# Patient Record
Sex: Female | Born: 1955
Health system: Southern US, Community
[De-identification: ages and names within clinical notes are randomized; demographics above are authoritative.]

## PROBLEM LIST (undated history)

## (undated) DIAGNOSIS — C801 Malignant (primary) neoplasm, unspecified: Secondary | ICD-10-CM

## (undated) DIAGNOSIS — K7581 Nonalcoholic steatohepatitis (NASH): Secondary | ICD-10-CM

## (undated) DIAGNOSIS — I10 Essential (primary) hypertension: Secondary | ICD-10-CM

## (undated) DIAGNOSIS — G4730 Sleep apnea, unspecified: Secondary | ICD-10-CM

## (undated) DIAGNOSIS — F32A Depression, unspecified: Secondary | ICD-10-CM

## (undated) DIAGNOSIS — E785 Hyperlipidemia, unspecified: Secondary | ICD-10-CM

## (undated) DIAGNOSIS — T7840XA Allergy, unspecified, initial encounter: Secondary | ICD-10-CM

## (undated) DIAGNOSIS — K219 Gastro-esophageal reflux disease without esophagitis: Secondary | ICD-10-CM

## (undated) DIAGNOSIS — K769 Liver disease, unspecified: Secondary | ICD-10-CM

## (undated) DIAGNOSIS — F329 Major depressive disorder, single episode, unspecified: Secondary | ICD-10-CM

## (undated) DIAGNOSIS — M4800 Spinal stenosis, site unspecified: Secondary | ICD-10-CM

## (undated) DIAGNOSIS — K2970 Gastritis, unspecified, without bleeding: Secondary | ICD-10-CM

## (undated) DIAGNOSIS — E119 Type 2 diabetes mellitus without complications: Secondary | ICD-10-CM

## (undated) HISTORY — DX: Major depressive disorder, single episode, unspecified: F32.9

## (undated) HISTORY — PX: OTHER SURGICAL HISTORY: SHX169

## (undated) HISTORY — DX: Gastritis, unspecified, without bleeding: K29.70

## (undated) HISTORY — DX: Depression, unspecified: F32.A

## (undated) HISTORY — DX: Liver disease, unspecified: K76.9

## (undated) HISTORY — PX: CARPAL TUNNEL RELEASE: SHX101

## (undated) HISTORY — DX: Gastro-esophageal reflux disease without esophagitis: K21.9

## (undated) HISTORY — PX: CERVICAL CONE BIOPSY: SUR198

## (undated) HISTORY — PX: MOHS SURGERY: SUR867

## (undated) HISTORY — DX: Essential (primary) hypertension: I10

## (undated) HISTORY — PX: CHOLECYSTECTOMY: SHX55

## (undated) HISTORY — PX: CERVICAL DISCECTOMY: SHX98

## (undated) HISTORY — DX: Type 2 diabetes mellitus without complications: E11.9

## (undated) HISTORY — PX: TONSILLECTOMY: SUR1361

## (undated) HISTORY — PX: LIVER BIOPSY: SHX301

## (undated) HISTORY — PX: APPENDECTOMY: SHX54

## (undated) HISTORY — PX: KNEE ARTHROSCOPY: SUR90

## (undated) HISTORY — PX: ROTATOR CUFF REPAIR: SHX139

## (undated) HISTORY — DX: Sleep apnea, unspecified: G47.30

## (undated) HISTORY — DX: Allergy, unspecified, initial encounter: T78.40XA

## (undated) HISTORY — DX: Hyperlipidemia, unspecified: E78.5

---

## 2004-06-06 ENCOUNTER — Ambulatory Visit: Payer: Self-pay | Admitting: Internal Medicine

## 2004-08-18 HISTORY — PX: BREAST BIOPSY: SHX20

## 2005-01-22 ENCOUNTER — Ambulatory Visit: Payer: Self-pay

## 2005-04-26 ENCOUNTER — Ambulatory Visit: Payer: Self-pay | Admitting: Internal Medicine

## 2005-04-30 ENCOUNTER — Ambulatory Visit: Payer: Self-pay | Admitting: Unknown Physician Specialty

## 2005-10-07 ENCOUNTER — Ambulatory Visit: Payer: Self-pay

## 2006-04-09 ENCOUNTER — Ambulatory Visit: Payer: Self-pay | Admitting: Internal Medicine

## 2006-05-13 ENCOUNTER — Ambulatory Visit: Payer: Self-pay | Admitting: Gerontology

## 2006-06-30 ENCOUNTER — Ambulatory Visit: Payer: Self-pay | Admitting: Unknown Physician Specialty

## 2006-07-06 ENCOUNTER — Ambulatory Visit: Payer: Self-pay | Admitting: Unknown Physician Specialty

## 2006-07-10 ENCOUNTER — Ambulatory Visit: Payer: Self-pay | Admitting: Unknown Physician Specialty

## 2006-07-23 ENCOUNTER — Ambulatory Visit: Payer: Self-pay | Admitting: Internal Medicine

## 2006-10-29 ENCOUNTER — Ambulatory Visit: Payer: Self-pay

## 2006-11-02 ENCOUNTER — Ambulatory Visit: Payer: Self-pay | Admitting: Unknown Physician Specialty

## 2006-11-16 ENCOUNTER — Ambulatory Visit: Payer: Self-pay | Admitting: Internal Medicine

## 2007-01-30 ENCOUNTER — Ambulatory Visit: Payer: Self-pay | Admitting: Unknown Physician Specialty

## 2007-03-08 ENCOUNTER — Ambulatory Visit: Payer: Self-pay | Admitting: Orthopaedic Surgery

## 2007-03-31 ENCOUNTER — Ambulatory Visit: Payer: Self-pay | Admitting: Pain Medicine

## 2007-04-07 ENCOUNTER — Ambulatory Visit: Payer: Self-pay | Admitting: Pain Medicine

## 2007-04-29 ENCOUNTER — Ambulatory Visit (HOSPITAL_COMMUNITY): Admission: RE | Admit: 2007-04-29 | Discharge: 2007-04-30 | Payer: Self-pay | Admitting: Neurosurgery

## 2007-05-21 ENCOUNTER — Encounter: Admission: RE | Admit: 2007-05-21 | Discharge: 2007-05-21 | Payer: Self-pay | Admitting: Neurosurgery

## 2007-06-02 ENCOUNTER — Ambulatory Visit: Payer: Self-pay | Admitting: Internal Medicine

## 2007-06-21 ENCOUNTER — Encounter: Admission: RE | Admit: 2007-06-21 | Discharge: 2007-06-21 | Payer: Self-pay | Admitting: Neurosurgery

## 2007-07-28 ENCOUNTER — Ambulatory Visit: Payer: Self-pay | Admitting: Neurosurgery

## 2007-08-23 ENCOUNTER — Ambulatory Visit: Payer: Self-pay | Admitting: Pain Medicine

## 2007-09-28 ENCOUNTER — Ambulatory Visit: Payer: Self-pay | Admitting: Pain Medicine

## 2007-10-13 ENCOUNTER — Ambulatory Visit: Payer: Self-pay | Admitting: Pain Medicine

## 2007-11-18 ENCOUNTER — Ambulatory Visit: Payer: Self-pay | Admitting: Pain Medicine

## 2007-12-01 ENCOUNTER — Ambulatory Visit: Payer: Self-pay | Admitting: Internal Medicine

## 2007-12-15 ENCOUNTER — Ambulatory Visit: Payer: Self-pay | Admitting: Pain Medicine

## 2007-12-28 ENCOUNTER — Ambulatory Visit: Payer: Self-pay | Admitting: Pain Medicine

## 2008-01-05 ENCOUNTER — Ambulatory Visit: Payer: Self-pay | Admitting: Pain Medicine

## 2008-01-05 ENCOUNTER — Ambulatory Visit: Payer: Self-pay

## 2008-01-19 ENCOUNTER — Ambulatory Visit: Payer: Self-pay

## 2008-02-01 ENCOUNTER — Ambulatory Visit: Payer: Self-pay | Admitting: Pain Medicine

## 2008-02-09 ENCOUNTER — Other Ambulatory Visit: Payer: Self-pay

## 2008-02-09 ENCOUNTER — Ambulatory Visit: Payer: Self-pay | Admitting: Orthopaedic Surgery

## 2008-02-17 ENCOUNTER — Ambulatory Visit: Payer: Self-pay | Admitting: Orthopaedic Surgery

## 2008-02-24 ENCOUNTER — Encounter: Payer: Self-pay | Admitting: Orthopaedic Surgery

## 2008-03-02 ENCOUNTER — Ambulatory Visit: Payer: Self-pay | Admitting: Pain Medicine

## 2008-03-18 ENCOUNTER — Encounter: Payer: Self-pay | Admitting: Orthopaedic Surgery

## 2008-03-29 ENCOUNTER — Ambulatory Visit: Payer: Self-pay | Admitting: Internal Medicine

## 2008-04-04 ENCOUNTER — Ambulatory Visit: Payer: Self-pay | Admitting: Pain Medicine

## 2008-04-18 ENCOUNTER — Encounter: Payer: Self-pay | Admitting: Orthopaedic Surgery

## 2008-05-11 ENCOUNTER — Ambulatory Visit: Payer: Self-pay | Admitting: Pain Medicine

## 2008-05-18 ENCOUNTER — Encounter: Payer: Self-pay | Admitting: Orthopaedic Surgery

## 2008-06-15 ENCOUNTER — Ambulatory Visit: Payer: Self-pay | Admitting: Pain Medicine

## 2008-06-19 ENCOUNTER — Emergency Department: Payer: Self-pay | Admitting: Emergency Medicine

## 2008-07-24 ENCOUNTER — Ambulatory Visit: Payer: Self-pay | Admitting: Internal Medicine

## 2008-12-27 ENCOUNTER — Ambulatory Visit: Payer: Self-pay | Admitting: Internal Medicine

## 2009-01-04 ENCOUNTER — Ambulatory Visit: Payer: Self-pay

## 2009-06-27 ENCOUNTER — Ambulatory Visit: Payer: Self-pay | Admitting: Internal Medicine

## 2010-12-31 NOTE — Op Note (Signed)
NAMENOVIE, MAGGIO            ACCOUNT NO.:  [id number]   MEDICAL RECORD NO.:  [id number]          PATIENT TYPE:  AMB   LOCATION:  SDS                          FACILITY:  [facility]   PHYSICIAN:  Faythe Ghee, M.D. DATE OF BIRTH:  06/07/1956   DATE OF PROCEDURE:  04/29/2007  DATE OF DISCHARGE:                               OPERATIVE REPORT   PREOPERATIVE DIAGNOSIS:  Herniated disc C6-C7, right.   POSTOPERATIVE DIAGNOSIS:  Herniated disc C6-C7, right.   PROCEDURE:  C6-C7 anterior cervical discectomy with bone bank fusion  followed by Mystique anterior cervical plating.   SURGEON:  Faythe Ghee, M.D.   ASSISTANT:  Eustace Moore, M.D.   PROCEDURE IN DETAIL:  After being placed in the supine position, 10  pounds halter traction, the patient's neck was prepped and draped in the  usual sterile fashion.  Localizing fluoroscopy was used prior to  incision to identify the appropriate level.  A transverse incision was  made in the right anterior neck, starting at the midline, and headed  towards the medial aspect of the sternocleidomastoid muscle.  The  platysmal muscle was then incised transversely.  The natural fascial  plane between the strap muscles medially and sternocleidomastoid  laterally was then identified and followed down to the anterior aspect  of the cervical spine.  The longus colli muscles were identified, split  in the midline, stripped away bilaterally with the Geophysicist/field seismologist.  Two discs were able to be exposed, the needle was placed  in the higher one at C5-C6 and, therefore, attention was turned to the  one below which was C6-C7.  The disc was then incised with a 15 blade  and thoroughly cleaned out with pituitary rongeurs and curets.  A high  speed drill was used to widen the interspace.  The microscope was draped  and brought in the field and used the remainder of the case.   Using microsection technique, the residual disc posterior  longitudinal  ligament was removed.  The cut edges of the ligament were then removed  and thorough decompression was carried out bilaterally along the C7  nerve roots.  Large amounts of herniated disc material and bony  overgrowth were encountered towards the right, symptomatic side.  This  was removed to decompress and expose the underlying C7 nerve root.  At  this time, inspection was carried out in all directions for any evidence  of residual compression and none could be identified.  Large amounts of  irrigation were carried out and any bleeding controlled with bipolar  coagulation and Gelfoam.  Measurements were taken and a 9 mm bone bank  plug was reconstituted.  After irrigating once more and confirming  hemostasis, the plug was impacted without difficulty and fluoroscopy  showed it to be in good position.   An appropriate length Mystique anterior cervical plate was then chosen.  Under fluoroscopic guidance, drill holes were placed followed by  tapping, re-tapping, and placing of 13 mm screws x4.  Final fluoroscopy  as best possible showed approach to the C6 level and good placement of  the screws and bony spacer.  At this time, large amounts of irrigation  were carried out.  Any bleeding was controlled with bipolar coagulation.  The wound was then closed with  interrupted Vicryl on the platysma muscle, inverted 5-0 PDS on the subcu  layer, and Steri-Strips on the skin.  A sterile dressing and soft collar  were applied and the patient was extubated and taken to the recovery  room in stable condition.           ______________________________  Faythe Ghee, M.D.     ROK/MEDQ  D:  04/29/2007  T:  04/29/2007  Job:  [id number]

## 2011-05-30 LAB — BASIC METABOLIC PANEL
BUN: 13
CO2: 31
Chloride: 100
Creatinine, Ser: 0.61
GFR calc non Af Amer: >60
Glucose, Bld: 92
Sodium: 138

## 2011-05-30 LAB — CBC
Hemoglobin: 15.9 — ABNORMAL HIGH
Platelets: 255
RDW: 13.3

## 2011-11-28 ENCOUNTER — Ambulatory Visit: Payer: Self-pay | Admitting: Internal Medicine

## 2012-01-26 ENCOUNTER — Ambulatory Visit: Payer: Self-pay | Admitting: Orthopedic Surgery

## 2012-02-03 ENCOUNTER — Ambulatory Visit: Payer: Self-pay | Admitting: Orthopedic Surgery

## 2012-02-16 ENCOUNTER — Encounter: Payer: Self-pay | Admitting: Physician Assistant

## 2012-03-26 ENCOUNTER — Ambulatory Visit: Payer: Self-pay | Admitting: Orthopedic Surgery

## 2012-07-14 ENCOUNTER — Other Ambulatory Visit: Payer: Self-pay | Admitting: Internal Medicine

## 2012-07-14 LAB — COMPREHENSIVE METABOLIC PANEL
Albumin: 3.7 g/dL (ref 3.4–5.0)
Alkaline Phosphatase: 132 U/L (ref 50–136)
Anion Gap: 3 — ABNORMAL LOW (ref 7–16)
Bilirubin,Total: 0.6 mg/dL (ref 0.2–1.0)
Creatinine: 0.69 mg/dL (ref 0.60–1.30)
Glucose: 103 mg/dL — ABNORMAL HIGH (ref 65–99)
Osmolality: 281 (ref 275–301)
Potassium: 4.5 mmol/L (ref 3.5–5.1)
Sodium: 141 mmol/L (ref 136–145)

## 2012-07-14 LAB — LIPID PANEL
Cholesterol: 198 mg/dL (ref 0–200)
HDL Cholesterol: 35 mg/dL — ABNORMAL LOW (ref 40–60)
Triglycerides: 193 mg/dL (ref 0–200)
VLDL Cholesterol, Calc: 39 mg/dL (ref 5–40)

## 2013-01-25 ENCOUNTER — Ambulatory Visit: Payer: Self-pay | Admitting: Internal Medicine

## 2013-02-01 ENCOUNTER — Ambulatory Visit: Payer: Self-pay

## 2013-03-15 ENCOUNTER — Ambulatory Visit: Payer: Self-pay | Admitting: Orthopedic Surgery

## 2013-04-06 ENCOUNTER — Ambulatory Visit: Payer: Self-pay | Admitting: Unknown Physician Specialty

## 2014-08-18 DIAGNOSIS — K2970 Gastritis, unspecified, without bleeding: Secondary | ICD-10-CM

## 2014-08-18 HISTORY — DX: Gastritis, unspecified, without bleeding: K29.70

## 2014-10-26 ENCOUNTER — Ambulatory Visit: Payer: Self-pay | Admitting: Unknown Physician Specialty

## 2014-10-27 ENCOUNTER — Ambulatory Visit: Payer: Self-pay | Admitting: Unknown Physician Specialty

## 2014-11-03 ENCOUNTER — Ambulatory Visit: Payer: Self-pay | Admitting: Unknown Physician Specialty

## 2014-11-13 ENCOUNTER — Ambulatory Visit: Payer: Self-pay | Admitting: Unknown Physician Specialty

## 2014-11-22 ENCOUNTER — Encounter: Payer: Self-pay | Admitting: *Deleted

## 2014-12-08 NOTE — Op Note (Signed)
PATIENT NAME:  Patricia [name], Patricia [name] MR#:  [id number] DATE OF BIRTH:  [birth year]  DATE OF PROCEDURE:  03/15/2013  PREOPERATIVE DIAGNOSIS: Right knee medial meniscus tear and osteoarthritis.   POSTOPERATIVE DIAGNOSIS: Right knee medial meniscus tear and osteoarthritis with plica band.   PROCEDURE: Arthroscopy of the right knee, partial medial meniscectomy and plica excision.   ANESTHESIA: General.   SURGEON: Laurene Footman, M.D.   DESCRIPTION OF PROCEDURE: The patient was brought to the operating room, and after adequate anesthesia was obtained, the right leg was prepped and draped in the usual sterile fashion with a tourniquet applied but not required, and the leg in the arthroscopic legholder. After prepping and draping and carrying out the timeout procedure with patient identification, an inferolateral portal was made, and the arthroscope was introduced. Initial inspection revealed mild patellofemoral disease. Coming around medially, there was a thick medial plica that appeared to impinge on the medial femoral condyle. This was subsequently ablated with the ArthroCare wand. Coming around medially, there was significant articular cartilage loss of the superficial layers. There was some fissuring, but there was no exposed bone. This was present on both femoral and tibial condyles. An inferomedial portal was made, and on probing there was a tear consistent with prior MRI of a very posterior horn tear, close the attachment on the capsule. The ACL was intact, and lateral compartment showed some fraying of the meniscus, but no tear. There was also some significant degenerative changes to the central aspect of the tibial condyle. Femoral condyle appeared relatively normal. The gutters were free of any loose bodies. A meniscal punch was used initially, followed by an ArthroCare wand to get back to a stable margin. After thorough debridement of the meniscus, and then addressing the plica band with the ArthroCare  wand, with pre- and postprocedure pictures having been obtained, the knee was thoroughly irrigated. Instrumentation withdrawn. The wounds were closed with simple interrupted 4-0 nylon. The wound then infiltrated with 20 mL of 0.5% Sensorcaine with epinephrine. Xeroform, 4 x 4's, Webril, and Ace wrap applied. The patient was sent to the recovery room in stable condition.   ESTIMATED BLOOD LOSS: Minimal.   COMPLICATIONS: None.   SPECIMENS: None.     ____________________________ Laurene Footman, MD mjm:dmm D: 03/15/2013 21:32:25 ET T: 03/15/2013 22:13:03 ET JOB#: [id number]  cc: Laurene Footman, MD, <Dictator> Laurene Footman MD ELECTRONICALLY SIGNED 03/16/2013 8:06

## 2014-12-10 NOTE — Op Note (Signed)
PATIENT NAME:  Patricia [name], KALATA MR#:  [id number] DATE OF BIRTH:  1956/07/25  DATE OF PROCEDURE:  02/03/2012  PREOPERATIVE DIAGNOSIS: Left knee medial meniscus tear.   POSTOPERATIVE DIAGNOSIS: Left knee medial meniscus tear, Lateral meniscus tear and patellar subluxation.   PROCEDURES:  1. Arthroscopy, left knee.  2. Partial medial and lateral meniscectomy. 3. Arthroscopic lateral release.   DESCRIPTION OF PROCEDURE:   SURGEON: Laurene Footman, M.D.   ANESTHESIA: General.  DESCRIPTION OF PROCEDURE: The patient was brought to the Operating Room and, after adequate general anesthesia was obtained, the left leg was placed in the arthroscopic legholder with a tourniquet applied to the upper thigh. After prepping and draping in the usual sterile fashion appropriate patient identification and time out procedures were completed. An inferolateral portal was made and the arthroscope was introduced. Initial inspection revealed significant chondromalacia of the patella, particularly the lateral facet, and significant subluxation laterally with a tight lateral retinaculum. Coming around medially, an inferomedial portal was made and on probing the posterior horn of the meniscus was noted to be torn at about the inner third and involving most of the posterior third. There was significant articular cartilage wear with some fissuring as well and small loose flaps of cartilage in both tibial and femoral condyles. The anterior cruciate ligament was intact. The lateral meniscus showed what appeared to be a partial discoid meniscus with a tear in the middle third on probing. This was debrided with use of meniscal punch and then ArthroCare wand to get back to a stable margin. The medial meniscus was addressed in a similar fashion using meniscal punch and ArthroCare wand with subsequent probing showing adequate removal of torn cartilage. At this point, with very tight lateral retinaculum, a lateral release was carried  out with the ArthroCare wand to get much better patellar alignment and after placing an arthroscopic cannula and after completing the procedure this was placed to thoroughly irrigate out the knee and remove any meniscal fragments. All instrumentation was then withdrawn and the wounds dressed with Xeroform, 4 x 4's, Webril, and Ace wrap with 20 mL of 0.5% Sensorcaine with epinephrine infiltrated into the area of the portals.   ESTIMATED BLOOD LOSS: Minimal.   COMPLICATIONS: None.   SPECIMENS: None. ____________________________ Laurene Footman, MD mjm:slb D: 02/03/2012 22:53:17 ET T: 02/04/2012 10:15:28 ET JOB#: [id number]  cc: Laurene Footman, MD, <Dictator> Laurene Footman MD ELECTRONICALLY SIGNED 02/04/2012 13:28

## 2014-12-11 LAB — SURGICAL PATHOLOGY

## 2015-01-05 ENCOUNTER — Other Ambulatory Visit: Payer: Self-pay | Admitting: Nurse Practitioner

## 2015-01-05 ENCOUNTER — Other Ambulatory Visit
Admission: RE | Admit: 2015-01-05 | Discharge: 2015-01-05 | Disposition: A | Discharge status: Home / Self Care | Payer: 59 | Source: Ambulatory Visit | Attending: Nurse Practitioner | Admitting: Nurse Practitioner

## 2015-01-05 DIAGNOSIS — R1011 Right upper quadrant pain: Secondary | ICD-10-CM | POA: Diagnosis not present

## 2015-01-05 DIAGNOSIS — R748 Abnormal levels of other serum enzymes: Secondary | ICD-10-CM

## 2015-01-05 DIAGNOSIS — G8929 Other chronic pain: Secondary | ICD-10-CM | POA: Insufficient documentation

## 2015-01-05 DIAGNOSIS — K7400 Hepatic fibrosis, unspecified: Secondary | ICD-10-CM | POA: Insufficient documentation

## 2015-01-05 DIAGNOSIS — K760 Fatty (change of) liver, not elsewhere classified: Secondary | ICD-10-CM | POA: Insufficient documentation

## 2015-01-05 LAB — BASIC METABOLIC PANEL
Anion gap: 7 (ref 5–15)
BUN: 9 mg/dL (ref 6–20)
CO2: 28 mmol/L (ref 22–32)
Calcium: 9 mg/dL (ref 8.9–10.3)
Chloride: 104 mmol/L (ref 101–111)
Creatinine, Ser: 0.81 mg/dL (ref 0.44–1.00)
GFR calc Af Amer: >60 mL/min (ref >60)
GFR calc non Af Amer: >60 mL/min (ref >60)
Glucose, Bld: 193 mg/dL — ABNORMAL HIGH (ref 65–99)
Potassium: 3.9 mmol/L (ref 3.5–5.1)
Sodium: 139 mmol/L (ref 135–145)

## 2015-01-12 ENCOUNTER — Ambulatory Visit
Admission: RE | Admit: 2015-01-12 | Discharge: 2015-01-12 | Disposition: A | Discharge status: Home / Self Care | Payer: 59 | Source: Ambulatory Visit | Attending: Nurse Practitioner | Admitting: Nurse Practitioner

## 2015-01-12 DIAGNOSIS — G8929 Other chronic pain: Secondary | ICD-10-CM | POA: Insufficient documentation

## 2015-01-12 DIAGNOSIS — R945 Abnormal results of liver function studies: Secondary | ICD-10-CM | POA: Diagnosis present

## 2015-01-12 DIAGNOSIS — K760 Fatty (change of) liver, not elsewhere classified: Secondary | ICD-10-CM

## 2015-01-12 DIAGNOSIS — K7400 Hepatic fibrosis, unspecified: Secondary | ICD-10-CM

## 2015-01-12 DIAGNOSIS — R7309 Other abnormal glucose: Secondary | ICD-10-CM | POA: Diagnosis not present

## 2015-01-12 DIAGNOSIS — R748 Abnormal levels of other serum enzymes: Secondary | ICD-10-CM

## 2015-01-12 DIAGNOSIS — R1011 Right upper quadrant pain: Secondary | ICD-10-CM | POA: Diagnosis present

## 2015-01-12 LAB — HEMOGLOBIN A1C: Hgb A1c MFr Bld: 6.4 % — ABNORMAL HIGH (ref 4.0–6.0)

## 2015-01-12 MED ORDER — GADOBENATE DIMEGLUMINE 529 MG/ML IV SOLN
20.0000 mL | Freq: Once | INTRAVENOUS | Status: AC | PRN
  Administered 2015-01-12: 20 mL via INTRAVENOUS

## 2015-02-07 ENCOUNTER — Encounter: Payer: 59 | Attending: Nurse Practitioner | Admitting: Dietician

## 2015-02-07 ENCOUNTER — Encounter: Payer: Self-pay | Admitting: Dietician

## 2015-02-07 VITALS — Ht 62.0 in | Wt 242.9 lb

## 2015-02-07 DIAGNOSIS — K769 Liver disease, unspecified: Secondary | ICD-10-CM

## 2015-02-07 DIAGNOSIS — K760 Fatty (change of) liver, not elsewhere classified: Secondary | ICD-10-CM | POA: Insufficient documentation

## 2015-02-07 DIAGNOSIS — E669 Obesity, unspecified: Secondary | ICD-10-CM | POA: Insufficient documentation

## 2015-02-07 NOTE — Patient Instructions (Signed)
Spread 10 servings of carbohydrate over 3 meals and 1-2 snacks. Read labels for saturated fat, trans fat and sodium with goal of no more 10 gms of saturated fat, 0 gms trans fat and no more than 2014m sodium/day. Include at least 5 servings of fruits/vegetables per day.

## 2015-02-07 NOTE — Progress Notes (Signed)
Medical Nutrition Therapy: Visit start time: 8:00 am  end time: 9:10am  Assessment:  Diagnosis: fatty liver, obesity Past medical history: hypertension, hyperlipidemia, pre-diabetes with HgA1c of 6.4 Psychosocial issues/ stress concerns: Pt. Rates her stress as "high" Preferred learning method:  Patricia [name] . Hands-on  Current weight: 242.9 lbs  Height: 62 in Medications, supplements: see list Progress and evaluation: Pt. In for initial nutrition assessment. Pt.has made significant diet changes in the past 2 months with weight loss of 6 lbs since her MD visit 12/2014. She is reading labels for sodium and is limiting to less than 2036m per day as instructed by her MD. She has significantly decreased her fat intake by eliminating fried foods and higher fat fast food choices. She is preparing most evening meals at home. Her vegetables are fresh, frozen or canned with no salt added.  Physical activity: none  Dietary Intake:  Usual eating pattern includes 3 meals and 2 snacks per day. Dining out frequency: 3-4 meals per week.  Breakfast: oatmeal or yogurt  Snack: unsalted almonds Lunch: Ex. Small steak, sweet potatoes, cucumber  Snack: fruit or peanut butter and crackers Supper: grilled chicken or salmon, broccoli, potatoes or corn or green peas Snack: typically no snack Beverages: Earl grey tea or water; has eliminated all sugar-sweetened beverages  Nutrition Care Education:  Fatty Liver/ Obesity: Instructed on a meal plan based on 1600 calories to promote continued weight loss including carbohydrate counting and balancing carbohydrate, protein, fat and non-starchy vegetables. Also, discussed low sodium products and low sodium food preparation encouraging to limit meals to 500-600 mg of sodium and practical ways to do this. Instructed on basic label reading including recommendations for saturated and trans fat.  Nutritional Diagnosis:  [place]-3.3 Overweight/obesity As related to history of frequent  high fat choices and lack of portion control.  As evidenced by diet history.  Intervention:  Spread 10 servings of carbohydrate over 3 meals and 1-2 snacks. Read labels for saturated fat, trans fat and sodium with goal of no more 10 gms of saturated fat, 0 gms trans fat and no more than 20049msodium/day. Include at least 5 servings of fruits/vegetables per day.  Education Materials given:  . Food lists/ Planning A Balanced Meal . Sample meal pattern/ menus . Goals/ instructions  Learner/ who was taught:  . Patient   Level of understanding: . Partial understanding; needs review/ practice  Learning barriers: . None  Willingness to learn/ readiness for change: . Eager, change in progress  Monitoring and Evaluation:  Dietary intake, exercise, , and body weight      follow up: 03/14/15 at 8:00am

## 2015-02-13 DIAGNOSIS — M51369 Other intervertebral disc degeneration, lumbar region without mention of lumbar back pain or lower extremity pain: Secondary | ICD-10-CM | POA: Insufficient documentation

## 2015-02-13 DIAGNOSIS — E66813 Obesity, class 3: Secondary | ICD-10-CM | POA: Insufficient documentation

## 2015-02-13 DIAGNOSIS — I10 Essential (primary) hypertension: Secondary | ICD-10-CM | POA: Insufficient documentation

## 2015-02-13 DIAGNOSIS — M5136 Other intervertebral disc degeneration, lumbar region: Secondary | ICD-10-CM | POA: Insufficient documentation

## 2015-02-13 DIAGNOSIS — F5104 Psychophysiologic insomnia: Secondary | ICD-10-CM | POA: Insufficient documentation

## 2015-02-13 DIAGNOSIS — Z6841 Body Mass Index (BMI) 40.0 and over, adult: Secondary | ICD-10-CM | POA: Insufficient documentation

## 2015-03-14 ENCOUNTER — Ambulatory Visit: Payer: 59 | Admitting: Dietician

## 2015-03-30 ENCOUNTER — Encounter: Payer: Self-pay | Admitting: Dietician

## 2015-03-30 ENCOUNTER — Ambulatory Visit: Payer: 59 | Attending: Otolaryngology

## 2015-03-30 DIAGNOSIS — G4733 Obstructive sleep apnea (adult) (pediatric): Secondary | ICD-10-CM | POA: Diagnosis not present

## 2015-03-30 DIAGNOSIS — R0683 Snoring: Secondary | ICD-10-CM | POA: Insufficient documentation

## 2015-04-09 ENCOUNTER — Ambulatory Visit: Payer: 59 | Attending: Otolaryngology

## 2015-04-09 DIAGNOSIS — G4733 Obstructive sleep apnea (adult) (pediatric): Secondary | ICD-10-CM | POA: Diagnosis not present

## 2015-04-09 DIAGNOSIS — R0683 Snoring: Secondary | ICD-10-CM | POA: Diagnosis present

## 2015-04-09 DIAGNOSIS — R0989 Other specified symptoms and signs involving the circulatory and respiratory systems: Secondary | ICD-10-CM | POA: Diagnosis present

## 2015-04-18 ENCOUNTER — Other Ambulatory Visit
Admission: RE | Admit: 2015-04-18 | Discharge: 2015-04-18 | Disposition: A | Discharge status: Home / Self Care | Payer: 59 | Source: Ambulatory Visit | Attending: Nurse Practitioner | Admitting: Nurse Practitioner

## 2015-04-18 DIAGNOSIS — K760 Fatty (change of) liver, not elsewhere classified: Secondary | ICD-10-CM | POA: Diagnosis present

## 2015-04-18 LAB — PROTIME-INR
INR: 1.07
Prothrombin Time: 14.1 seconds (ref 11.4–15.0)

## 2015-06-21 DIAGNOSIS — E559 Vitamin D deficiency, unspecified: Secondary | ICD-10-CM | POA: Insufficient documentation

## 2015-06-22 DIAGNOSIS — M5442 Lumbago with sciatica, left side: Secondary | ICD-10-CM | POA: Insufficient documentation

## 2015-06-22 DIAGNOSIS — M5441 Lumbago with sciatica, right side: Secondary | ICD-10-CM | POA: Insufficient documentation

## 2015-07-11 ENCOUNTER — Other Ambulatory Visit: Payer: Self-pay | Admitting: Nurse Practitioner

## 2015-07-11 DIAGNOSIS — K760 Fatty (change of) liver, not elsewhere classified: Secondary | ICD-10-CM

## 2015-07-18 ENCOUNTER — Ambulatory Visit
Admission: RE | Admit: 2015-07-18 | Discharge: 2015-07-18 | Disposition: A | Discharge status: Home / Self Care | Payer: 59 | Source: Ambulatory Visit | Attending: Nurse Practitioner | Admitting: Nurse Practitioner

## 2015-07-18 DIAGNOSIS — K760 Fatty (change of) liver, not elsewhere classified: Secondary | ICD-10-CM | POA: Insufficient documentation

## 2015-08-22 ENCOUNTER — Other Ambulatory Visit: Payer: Self-pay | Admitting: Internal Medicine

## 2015-08-22 DIAGNOSIS — Z0000 Encounter for general adult medical examination without abnormal findings: Secondary | ICD-10-CM | POA: Diagnosis not present

## 2015-08-22 DIAGNOSIS — Z23 Encounter for immunization: Secondary | ICD-10-CM | POA: Diagnosis not present

## 2015-08-22 DIAGNOSIS — Z124 Encounter for screening for malignant neoplasm of cervix: Secondary | ICD-10-CM | POA: Diagnosis not present

## 2015-08-22 DIAGNOSIS — Z1239 Encounter for other screening for malignant neoplasm of breast: Secondary | ICD-10-CM | POA: Diagnosis not present

## 2015-08-24 DIAGNOSIS — G4733 Obstructive sleep apnea (adult) (pediatric): Secondary | ICD-10-CM | POA: Diagnosis not present

## 2015-08-30 ENCOUNTER — Other Ambulatory Visit: Payer: Self-pay | Admitting: Internal Medicine

## 2015-08-30 DIAGNOSIS — Z780 Asymptomatic menopausal state: Secondary | ICD-10-CM

## 2015-09-03 ENCOUNTER — Ambulatory Visit: Payer: 59

## 2015-09-11 ENCOUNTER — Other Ambulatory Visit: Payer: Self-pay | Admitting: Internal Medicine

## 2015-09-11 ENCOUNTER — Ambulatory Visit
Admission: RE | Admit: 2015-09-11 | Discharge: 2015-09-11 | Disposition: A | Discharge status: Home / Self Care | Payer: 59 | Source: Ambulatory Visit | Attending: Internal Medicine | Admitting: Internal Medicine

## 2015-09-11 DIAGNOSIS — Z1231 Encounter for screening mammogram for malignant neoplasm of breast: Secondary | ICD-10-CM | POA: Insufficient documentation

## 2015-09-11 DIAGNOSIS — Z780 Asymptomatic menopausal state: Secondary | ICD-10-CM

## 2015-09-11 DIAGNOSIS — Z1239 Encounter for other screening for malignant neoplasm of breast: Secondary | ICD-10-CM

## 2015-09-11 DIAGNOSIS — Z13820 Encounter for screening for osteoporosis: Secondary | ICD-10-CM | POA: Diagnosis not present

## 2015-09-11 HISTORY — DX: Malignant (primary) neoplasm, unspecified: C80.1

## 2015-09-21 DIAGNOSIS — M545 Low back pain: Secondary | ICD-10-CM | POA: Diagnosis not present

## 2015-09-21 DIAGNOSIS — M4806 Spinal stenosis, lumbar region: Secondary | ICD-10-CM | POA: Diagnosis not present

## 2015-11-07 ENCOUNTER — Emergency Department: Payer: 59

## 2015-11-07 ENCOUNTER — Encounter: Payer: Self-pay | Admitting: Medical Oncology

## 2015-11-07 ENCOUNTER — Emergency Department
Admission: EM | Admit: 2015-11-07 | Discharge: 2015-11-07 | Disposition: A | Discharge status: Home / Self Care | Payer: 59 | Attending: Emergency Medicine | Admitting: Emergency Medicine

## 2015-11-07 DIAGNOSIS — Z791 Long term (current) use of non-steroidal anti-inflammatories (NSAID): Secondary | ICD-10-CM | POA: Insufficient documentation

## 2015-11-07 DIAGNOSIS — Z79899 Other long term (current) drug therapy: Secondary | ICD-10-CM | POA: Diagnosis not present

## 2015-11-07 DIAGNOSIS — I10 Essential (primary) hypertension: Secondary | ICD-10-CM | POA: Insufficient documentation

## 2015-11-07 DIAGNOSIS — R079 Chest pain, unspecified: Secondary | ICD-10-CM | POA: Diagnosis not present

## 2015-11-07 DIAGNOSIS — R1012 Left upper quadrant pain: Secondary | ICD-10-CM | POA: Diagnosis not present

## 2015-11-07 DIAGNOSIS — F17200 Nicotine dependence, unspecified, uncomplicated: Secondary | ICD-10-CM | POA: Insufficient documentation

## 2015-11-07 DIAGNOSIS — B349 Viral infection, unspecified: Secondary | ICD-10-CM | POA: Diagnosis not present

## 2015-11-07 DIAGNOSIS — R509 Fever, unspecified: Secondary | ICD-10-CM | POA: Diagnosis present

## 2015-11-07 HISTORY — DX: Spinal stenosis, site unspecified: M48.00

## 2015-11-07 LAB — CBC
HEMATOCRIT: 47.2 % — AB (ref 35.0–47.0)
Hemoglobin: 16.1 g/dL — ABNORMAL HIGH (ref 12.0–16.0)
MCH: 31.3 pg (ref 26.0–34.0)
MCHC: 34.2 g/dL (ref 32.0–36.0)
MCV: 91.6 fL (ref 80.0–100.0)
Platelets: 111 10*3/uL — ABNORMAL LOW (ref 150–440)
RBC: 5.15 MIL/uL (ref 3.80–5.20)
RDW: 12.9 % (ref 11.5–14.5)
WBC: 7.5 10*3/uL (ref 3.6–11.0)

## 2015-11-07 LAB — BASIC METABOLIC PANEL
Anion gap: 8 (ref 5–15)
BUN: 10 mg/dL (ref 6–20)
CHLORIDE: 102 mmol/L (ref 101–111)
CO2: 25 mmol/L (ref 22–32)
Calcium: 9.2 mg/dL (ref 8.9–10.3)
Creatinine, Ser: 0.53 mg/dL (ref 0.44–1.00)
GFR calc Af Amer: >60 mL/min (ref >60)
GFR calc non Af Amer: >60 mL/min (ref >60)
GLUCOSE: 153 mg/dL — AB (ref 65–99)
POTASSIUM: 3.8 mmol/L (ref 3.5–5.1)
Sodium: 135 mmol/L (ref 135–145)

## 2015-11-07 LAB — RAPID INFLUENZA A&B ANTIGENS (ARMC ONLY): INFLUENZA B (ARMC): NEGATIVE

## 2015-11-07 LAB — RAPID INFLUENZA A&B ANTIGENS: Influenza A (ARMC): NEGATIVE

## 2015-11-07 LAB — TROPONIN I: Troponin I: <0.03 ng/mL (ref <0.031)

## 2015-11-07 MED ORDER — KETOROLAC TROMETHAMINE 30 MG/ML IJ SOLN
30.0000 mg | Freq: Once | INTRAMUSCULAR | Status: DC
  Filled 2015-11-07: qty 1

## 2015-11-07 MED ORDER — GI COCKTAIL ~~LOC~~
30.0000 mL | Freq: Once | ORAL | Status: AC
  Administered 2015-11-07: 30 mL via ORAL
  Filled 2015-11-07: qty 30

## 2015-11-07 MED ORDER — ONDANSETRON HCL 4 MG PO TABS: 4.0000 mg | ORAL_TABLET | Freq: Every day | ORAL | Status: DC | PRN

## 2015-11-07 MED ORDER — ONDANSETRON 4 MG PO TBDP
4.0000 mg | ORAL_TABLET | Freq: Once | ORAL | Status: AC
  Administered 2015-11-07: 4 mg via ORAL
  Filled 2015-11-07: qty 1

## 2015-11-07 MED ORDER — KETOROLAC TROMETHAMINE 30 MG/ML IJ SOLN
30.0000 mg | Freq: Once | INTRAMUSCULAR | Status: AC
  Administered 2015-11-07: 30 mg via INTRAVENOUS

## 2015-11-07 MED ORDER — ALBUTEROL SULFATE (2.5 MG/3ML) 0.083% IN NEBU
2.5000 mg | INHALATION_SOLUTION | Freq: Once | RESPIRATORY_TRACT | Status: AC
  Administered 2015-11-07: 2.5 mg via RESPIRATORY_TRACT
  Filled 2015-11-07: qty 3

## 2015-11-07 MED ORDER — ALBUTEROL SULFATE HFA 108 (90 BASE) MCG/ACT IN AERS: 2.0000 | INHALATION_SPRAY | Freq: Four times a day (QID) | RESPIRATORY_TRACT | Status: DC | PRN

## 2015-11-07 MED ORDER — SODIUM CHLORIDE 0.9 % IV SOLN
Freq: Once | INTRAVENOUS | Status: AC
  Administered 2015-11-07: 10:00:00 via INTRAVENOUS

## 2015-11-07 NOTE — Discharge Instructions (Signed)
Viral Infections A viral infection can be caused by different types of viruses.Most viral infections are not serious and resolve on their own. However, some infections may cause severe symptoms and may lead to further complications. SYMPTOMS Viruses can frequently cause:  Minor sore throat.  Aches and pains.  Headaches.  Runny nose.  Different types of rashes.  Watery eyes.  Tiredness.  Cough.  Loss of appetite.  Gastrointestinal infections, resulting in nausea, vomiting, and diarrhea. These symptoms do not respond to antibiotics because the infection is not caused by bacteria. However, you might catch a bacterial infection following the viral infection. This is sometimes called a "superinfection." Symptoms of such a bacterial infection may include:  Worsening sore throat with pus and difficulty swallowing.  Swollen neck glands.  Chills and a high or persistent fever.  Severe headache.  Tenderness over the sinuses.  Persistent overall ill feeling (malaise), muscle aches, and tiredness (fatigue).  Persistent cough.  Yellow, green, or brown mucus production with coughing. HOME CARE INSTRUCTIONS   Only take over-the-counter or prescription medicines for pain, discomfort, diarrhea, or fever as directed by your caregiver.  Drink enough water and fluids to keep your urine clear or pale yellow. Sports drinks can provide valuable electrolytes, sugars, and hydration.  Get plenty of rest and maintain proper nutrition. Soups and broths with crackers or rice are fine. SEEK IMMEDIATE MEDICAL CARE IF:   You have severe headaches, shortness of breath, chest pain, neck pain, or an unusual rash.  You have uncontrolled vomiting, diarrhea, or you are unable to keep down fluids.  You or your child has an oral temperature above 102 F (38.9 C), not controlled by medicine.  Your baby is older than 3 months with a rectal temperature of 102 F (38.9 C) or higher.  Your baby is 51  months old or younger with a rectal temperature of 100.4 F (38 C) or higher. MAKE SURE YOU:   Understand these instructions.  Will watch your condition.  Will get help right away if you are not doing well or get worse.   This information is not intended to replace advice given to you by your health care provider. Make sure you discuss any questions you have with your health care provider.   Document Released: 05/14/2005 Document Revised: 10/27/2011 Document Reviewed: 01/10/2015 Elsevier Interactive Patient Education [employer].

## 2015-11-07 NOTE — ED Provider Notes (Addendum)
[facility] Emergency Department Provider Note  ____________________________________________  Time seen: Approximately 930 AM  I have reviewed the triage vital signs and the nursing notes.   HISTORY  Chief Complaint Chest Pain; Abdominal Pain; Fever; and Diarrhea    HPI Patricia [name] is a 60 y.o. female with a history of hypertension and cervical cancer was presenting to the emergency department today with 3 days of cough, nausea and diarrhea as well as upper abdominal pain. She is also complaining of chest pain and a headache. She says that she has had a fever at home. Was also exposed to a relative last week with a "nasty virus." Says that the chest pain has been migrating from the left side yesterday and now is on the right side today. It is cramping. It is not worsened with deep breathing. It is not worsened with exertion. However, the patient does report shortness of breath with exertion. She does still actively smoke. She says that her headache has also been fluctuating and is of 5-6 out of 10 at this time the top of her head and posteriorly. She denies any neck pain. She also says she is cramping abdominal pain is to the left upper quadrant. No recent antibiotic use.   Past Medical History  Diagnosis Date  . Hypertension   . Hyperlipidemia   . GERD (gastroesophageal reflux disease)   . Hepatic disease   . Cancer ([place])     cervical  . Spinal stenosis     There are no active problems to display for this patient.   Past Surgical History  Procedure Laterality Date  . Breast biopsy Right 2006    bx/clip-neg    Current Outpatient Rx  Name  Route  Sig  Dispense  Refill  . Dexlansoprazole (DEXILANT) 30 MG capsule      TAKE 1 CAPSULE (30 MG TOTAL) BY MOUTH ONCE DAILY.         . furosemide (LASIX) 20 MG tablet   Oral   Take 20 mg by mouth daily as needed for edema.         . gabapentin (NEURONTIN) 300 MG capsule   Oral   Take 300 mg by  mouth at bedtime.         . Glucosamine-Chondroitin 250-200 MG TABS   Oral   Take 1 tablet by mouth 2 (two) times daily.          . naproxen sodium (ANAPROX) 220 MG tablet   Oral   Take 220 mg by mouth 2 (two) times daily with a meal.         . propranolol ER (INDERAL LA) 60 MG 24 hr capsule      TAKE ONE CAPSULE BY MOUTH ONCE DAILY         . sucralfate (CARAFATE) 1 G tablet   Oral   Take 1 g by mouth 2 (two) times daily.          . traMADol (ULTRAM) 50 MG tablet   Oral   Take 50 mg by mouth every 6 (six) hours as needed.         . zolpidem (AMBIEN) 5 MG tablet   Oral   Take 5 mg by mouth at bedtime.           Allergies Amlodipine; Codeine; Deconsal ii; Other; Pantoprazole sodium; Promethazine; Pseudoephedrine; Paxil ; Omeprazole; and Povidone iodine  No family history on file.  Social History Social History  Substance Use Topics  .  Smoking status: Current Every Day Smoker  . Smokeless tobacco: None  . Alcohol Use: No    Review of Systems Constitutional: Fever Eyes: No visual changes. ENT: No sore throat. Cardiovascular: As above Respiratory: As above Gastrointestinal:   No constipation. Genitourinary: Negative for dysuria. Musculoskeletal: Negative for back pain. Skin: Negative for rash. Neurological: Negative for focal weakness or numbness.  10-point ROS otherwise negative.  ____________________________________________   PHYSICAL EXAM:  VITAL SIGNS: ED Triage Vitals  Enc Vitals Group     BP 11/07/15 0835 132/84 mmHg     Pulse Rate 11/07/15 0835 80     Resp 11/07/15 0835 17     Temp 11/07/15 0835 98.6 F (37 C)     Temp Source 11/07/15 0835 Oral     SpO2 11/07/15 0835 94 %     Weight 11/07/15 0832 239 lb (108.41 kg)     Height 11/07/15 0832 5' 2"  (1.575 m)     Head Cir --      Peak Flow --      Pain Score 11/07/15 0832 5     Pain Loc --      Pain Edu? --      Excl. in [place]? --     Constitutional: Alert and oriented. Well  appearing and in no acute distress. Eyes: Conjunctivae are normal. PERRL. EOMI. Head: Atraumatic. Nose: No congestion/rhinnorhea. Mouth/Throat: Mucous membranes are moist.   Neck: No stridor.   Cardiovascular: Normal rate, regular rhythm. Grossly normal heart sounds.  Good peripheral circulation.  Chest pain is not reproducible palpation to the right. Respiratory: Normal respiratory effort.  No retractions. Scant expiratory wheezes throughout. Gastrointestinal: Soft with mild upper abdominal tenderness without a positive Murphy's. There is no rebound or guarding. There is no lower abdominal tenderness palpation. No CVA tenderness. Musculoskeletal: No lower extremity tenderness nor edema.  No joint effusions. Neurologic:  Normal speech and language. No gross focal neurologic deficits are appreciated. No gait instability. Skin:  Skin is warm, dry and intact. No rash noted. Psychiatric: Mood and affect are normal. Speech and behavior are normal.  ____________________________________________   LABS (all labs ordered are listed, but only abnormal results are displayed)  Labs Reviewed  BASIC METABOLIC PANEL - Abnormal; Notable for the following:    Glucose, Bld 153 (*)    All other components within normal limits  CBC - Abnormal; Notable for the following:    Hemoglobin 16.1 (*)    HCT 47.2 (*)    Platelets 111 (*)    All other components within normal limits  RAPID INFLUENZA A&B ANTIGENS ([facility] ONLY)  TROPONIN I   ____________________________________________  EKG  ED ECG REPORT I, Doran Stabler, the attending physician, personally viewed and interpreted this ECG.   Date: 11/07/2015  EKG Time: 833  Rate: 82  Rhythm: normal sinus rhythm  Axis: Left axis deviation  Intervals:none  ST&T Change: No ST segment elevation or depression. No abnormal T-wave inversion.  ____________________________________________  RADIOLOGY  MPRESSION: No radiographic evidence of acute  cardiopulmonary disease.  Signed,  Dulcy Fanny. Earleen Newport, DO  [facility]  [facility]   Electronically Signed By: Corrie Mckusick D.O. On: 11/07/2015 09:29 ____________________________________________   PROCEDURES   ____________________________________________   INITIAL IMPRESSION / ASSESSMENT AND PLAN / ED COURSE  Pertinent labs & imaging results that were available during my care of the patient were reviewed by me and considered in my medical decision making (see chart for details).  ----------------------------------------- 11:38  AM on 11/07/2015 -----------------------------------------  Patient says that she feels much improved. Very reassuring lab as well as EKG workup. Lungs were re-auscultated and now are completely clear. Negative flu test. Likely viral syndrome causing the patient's presentation. We'll discharge with albuterol inhaler as well as Zofran. Recommended to the patient to stay hydrated as well as to continue to reduce the amount that she is smoking. She'll use Tylenol at home for body aches. The patient understands the plan and is willing to comply. ____________________________________________   FINAL CLINICAL IMPRESSION(S) / ED DIAGNOSES  Viral syndrome.    Orbie Pyo, MD 11/07/15 1139  Also on reevaluation the oxygen level was 97% on room air.  Orbie Pyo, MD 11/07/15 [phone]

## 2015-11-07 NOTE — ED Notes (Signed)
Pt reports she began 2 days ago with fever, chest pain, upper abd pain, nausea and diarrhea. Pt also reports sob with exertion.

## 2015-11-07 NOTE — ED Notes (Signed)
EKG completed and given to [employer]

## 2015-11-21 DIAGNOSIS — M5136 Other intervertebral disc degeneration, lumbar region: Secondary | ICD-10-CM | POA: Diagnosis not present

## 2015-11-21 DIAGNOSIS — M5441 Lumbago with sciatica, right side: Secondary | ICD-10-CM | POA: Diagnosis not present

## 2015-11-21 DIAGNOSIS — K2900 Acute gastritis without bleeding: Secondary | ICD-10-CM | POA: Diagnosis not present

## 2015-11-21 DIAGNOSIS — I10 Essential (primary) hypertension: Secondary | ICD-10-CM | POA: Diagnosis not present

## 2015-11-26 DIAGNOSIS — G4733 Obstructive sleep apnea (adult) (pediatric): Secondary | ICD-10-CM | POA: Diagnosis not present

## 2015-12-20 ENCOUNTER — Other Ambulatory Visit: Payer: Self-pay | Admitting: Nurse Practitioner

## 2015-12-20 DIAGNOSIS — R1011 Right upper quadrant pain: Secondary | ICD-10-CM

## 2015-12-20 DIAGNOSIS — K7400 Hepatic fibrosis, unspecified: Secondary | ICD-10-CM

## 2015-12-20 DIAGNOSIS — K760 Fatty (change of) liver, not elsewhere classified: Secondary | ICD-10-CM

## 2015-12-20 DIAGNOSIS — G8929 Other chronic pain: Secondary | ICD-10-CM | POA: Diagnosis not present

## 2015-12-20 DIAGNOSIS — E119 Type 2 diabetes mellitus without complications: Secondary | ICD-10-CM | POA: Diagnosis not present

## 2015-12-25 ENCOUNTER — Ambulatory Visit
Admission: RE | Admit: 2015-12-25 | Discharge: 2015-12-25 | Disposition: A | Discharge status: Home / Self Care | Payer: 59 | Source: Ambulatory Visit | Attending: Nurse Practitioner | Admitting: Nurse Practitioner

## 2015-12-25 DIAGNOSIS — Z9049 Acquired absence of other specified parts of digestive tract: Secondary | ICD-10-CM | POA: Diagnosis not present

## 2015-12-25 DIAGNOSIS — G8929 Other chronic pain: Secondary | ICD-10-CM | POA: Diagnosis not present

## 2015-12-25 DIAGNOSIS — K7400 Hepatic fibrosis, unspecified: Secondary | ICD-10-CM

## 2015-12-25 DIAGNOSIS — K760 Fatty (change of) liver, not elsewhere classified: Secondary | ICD-10-CM | POA: Insufficient documentation

## 2015-12-25 DIAGNOSIS — R1011 Right upper quadrant pain: Secondary | ICD-10-CM | POA: Diagnosis not present

## 2015-12-26 DIAGNOSIS — E119 Type 2 diabetes mellitus without complications: Secondary | ICD-10-CM | POA: Diagnosis not present

## 2016-01-10 ENCOUNTER — Encounter: Payer: 59 | Attending: Internal Medicine | Admitting: *Deleted

## 2016-01-10 ENCOUNTER — Encounter: Payer: Self-pay | Admitting: *Deleted

## 2016-01-10 VITALS — BP 136/82 | Ht 62.0 in | Wt 241.5 lb

## 2016-01-10 DIAGNOSIS — E119 Type 2 diabetes mellitus without complications: Secondary | ICD-10-CM | POA: Insufficient documentation

## 2016-01-10 NOTE — Patient Instructions (Addendum)
Check blood sugars 2 x day before breakfast and 2 hrs after supper every day Exercise: Begin walking for  10-15  minutes 3 days a week as tolerated Eat 3 meals day,   2  snacks a day Space meals 4-6 hours apart Make an eye doctor appointment Bring blood sugar records to the next class

## 2016-01-10 NOTE — Progress Notes (Signed)
Diabetes Self-Management Education  Visit Type: First/Initial  Appt. Start Time: 1555 Appt. End Time: 0923  01/10/2016  Patricia [name], identified by name and date of birth, is a 60 y.o. female with a diagnosis of Diabetes: Type 2.   ASSESSMENT  Blood pressure 136/82, height 5' 2"  (1.575 m), weight 241 lb 8 oz (109.544 kg). Body mass index is 44.16 kg/(m^2).      Diabetes Self-Management Education - 01/10/16 1725    Visit Information   Visit Type First/Initial   Initial Visit   Diabetes Type Type 2   Are you currently following a meal plan? Yes   What type of meal plan do you follow? "no sugar, low sodium, trying to eat healthier"   Are you taking your medications as prescribed? Yes   Date Diagnosed May 2017   Health Coping   How would you rate your overall health? Fair   Psychosocial Assessment   Patient Belief/Attitude about Diabetes Motivated to manage diabetes   Self-care barriers None   Self-management support Doctor's office   Patient Concerns Nutrition/Meal planning;Medication;Glycemic Control;Weight Control;Healthy Lifestyle   Special Needs None   Preferred Learning Style Auditory;Visual;Hands on   [place] in progress   How often do you need to have someone help you when you read instructions, pamphlets, or other written materials from your doctor or pharmacy? 1 - Never   What is the last grade level you completed in school? ADN   Pre-Education Assessment   Patient understands the diabetes disease and treatment process. Needs Review   Patient understands incorporating nutritional management into lifestyle. Needs Review   Patient undertands incorporating physical activity into lifestyle. Needs Instruction   Patient understands using medications safely. Needs Instruction   Patient understands monitoring blood glucose, interpreting and using results Needs Review   Patient understands prevention, detection, and treatment of acute complications.  Needs Instruction   Patient understands prevention, detection, and treatment of chronic complications. Needs Review   Patient understands how to develop strategies to address psychosocial issues. Needs Instruction   Patient understands how to develop strategies to promote health/change behavior. Needs Instruction   Complications   Last HgB A1C per patient/outside source 8.2 %  12/20/15   How often do you check your blood sugar? 1-2 times/day   Fasting Blood glucose range (mg/dL) 70-129;130-179   Postprandial Blood glucose range (mg/dL) --  bedtime readings 91-195 mg/dL    Have you had a dilated eye exam in the past 12 months? No   Have you had a dental exam in the past 12 months? No   Are you checking your feet? Yes   How many days per week are you checking your feet? 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   Medications Reviewed patients medication for diabetes, action, purpose, timing of dose and side effects.   Monitoring Purpose and frequency of SMBG.;Identified appropriate SMBG and/or A1C goals.   Chronic complications Relationship between chronic complications and blood glucose  control   Psychosocial adjustment Identified and addressed patients feelings and concerns about diabetes   Personal strategies to promote health Review risk of smoking and offered smoking cessation   Individualized Goals (developed by patient)   Reducing Risk Improve blood sugars Decrease medications Prevent diabetes complications Lose weight Lead a healthier lifestyle Become more fit   Outcomes   Expected Outcomes Demonstrated interest in learning. Expect positive outcomes      Individualized Plan for Diabetes Self-Management Training:   Learning Objective:  Patient will have a greater understanding of diabetes self-management. Patient education plan is to attend individual and/or group sessions per assessed needs and concerns.   Plan:   Patient Instructions  Check blood sugars 2 x day before breakfast and 2 hrs after supper every day Exercise: Begin walking for  10-15  minutes 3 days a week as tolerated Eat 3 meals day,   2  snacks a day Space meals 4-6 hours apart Make an eye doctor appointment Bring blood sugar records to the next class   Expected Outcomes:  Demonstrated interest in learning. Expect positive outcomes  Education material provided:  General Meal Planning Guidelines Simple Meal Plan  If problems or questions, patient to contact team via:   Johny Drilling, [place], [place], CDE [phone]  Future DSME appointment:  Monday January 21, 2016 for Diabetes Class 1

## 2016-01-21 ENCOUNTER — Encounter: Payer: Self-pay | Admitting: Dietician

## 2016-01-21 ENCOUNTER — Encounter: Payer: 59 | Attending: Internal Medicine | Admitting: Dietician

## 2016-01-21 DIAGNOSIS — E119 Type 2 diabetes mellitus without complications: Secondary | ICD-10-CM | POA: Insufficient documentation

## 2016-01-21 NOTE — Progress Notes (Signed)

## 2016-01-28 ENCOUNTER — Encounter: Payer: 59 | Admitting: *Deleted

## 2016-01-28 ENCOUNTER — Encounter: Payer: Self-pay | Admitting: *Deleted

## 2016-01-28 VITALS — Wt 237.0 lb

## 2016-01-28 DIAGNOSIS — E119 Type 2 diabetes mellitus without complications: Secondary | ICD-10-CM | POA: Diagnosis not present

## 2016-01-28 NOTE — Progress Notes (Signed)
Appt. Start Time: 1730 Appt. End Time: 2030  Class 2 Diabetes Overview - define DM; state own type of DM; identify functions of pancreas and insulin; define insulin deficiency vs insulin resistance  Psychosocial - identify DM as a source of stress; state the effects of stress on BG control; verbalize appropriate stress management techniques; identify personal stress issues   Nutritional Management - describe effects of food on blood glucose; identify sources of carbohydrate, protein and fat; verbalize the importance of balance meals in controlling blood glucose; identify meals as well balanced or not; estimate servings of carbohydrate from menus; use food labels to identify servings size, content of carbohydrate, fiber, protein, fat, saturated fat and sodium; recognize food sources of fat, saturated fat, trans fat, sodium and verbalize goals for intake; describe healthful appropriate food choices when dining out   Exercise - describe the effects of exercise on blood glucose and importance of regular exercise in controlling diabetes; state a plan for personal exercise; verbalize contraindications for exercise  Medications - state name, dose, timing of currently prescribed medications; describe types of medications available for diabetes  Self-Monitoring - state importance of HBGM and demo procedure accurately; use HBGM results to effectively manage diabetes; identify importance of regular HbA1C testing and goals for results  Acute Complications/Sick Day Guidelines - recognize hyperglycemia and hypoglycemia with causes and effects; identify blood glucose results as high, low or in control; list steps in treating and preventing high and low blood glucose; state appropriate measure to manage blood glucose when ill (need for meds, HBGM plan, when to call physician, need for fluids)  Chronic Complications/Foot, Skin, Eye Dental Care - identify possible long-term complications of diabetes (retinopathy,  neuropathy, nephropathy, cardiovascular disease, infections); explain steps in prevention and treatment of chronic complications; state importance of daily self-foot exams; describe how to examine feet and what to look for; explain appropriate eye and dental care  Lifestyle Changes/Goals & Health/Community Resources - state benefits of making appropriate lifestyle changes; identify habits that need to change (meals, tobacco, alcohol); identify strategies to reduce risk factors for personal health; set goals for proper diabetes care; state need for and frequency of healthcare follow-up; describe appropriate community resources for good health (ADA, web sites, apps)   Pregnancy/Sexual Health - define gestational diabetes; state importance of good blood glucose control and birth control prior to pregnancy; state importance of good blood glucose control in preventing sexual problems (impotence, vaginal dryness, infections, loss of desire); state relationship of blood glucose control and pregnancy outcome; describe risk of maternal and fetal complications  Teaching Materials Used: Class 2 Slide Packet A1C Pamphlet Foot Care Literature Menu Ideas Goals for Class 2

## 2016-01-29 NOTE — Addendum Note (Signed)
Addended by: Inis Sizer on: 01/29/2016 10:15 AM   Modules accepted: Medications

## 2016-01-30 DIAGNOSIS — E119 Type 2 diabetes mellitus without complications: Secondary | ICD-10-CM | POA: Diagnosis not present

## 2016-02-04 ENCOUNTER — Encounter: Payer: Self-pay | Admitting: Dietician

## 2016-02-04 ENCOUNTER — Encounter: Payer: 59 | Admitting: Dietician

## 2016-02-04 VITALS — BP 134/70 | Ht 62.0 in | Wt 237.1 lb

## 2016-02-04 DIAGNOSIS — E119 Type 2 diabetes mellitus without complications: Secondary | ICD-10-CM

## 2016-02-04 NOTE — Progress Notes (Signed)

## 2016-02-06 DIAGNOSIS — B372 Candidiasis of skin and nail: Secondary | ICD-10-CM | POA: Diagnosis not present

## 2016-02-06 DIAGNOSIS — E119 Type 2 diabetes mellitus without complications: Secondary | ICD-10-CM | POA: Diagnosis not present

## 2016-02-13 NOTE — Patient Outreach (Addendum)
[facility]) Care Management  02/13/2016  Patricia [name] [birth year] [id number]   [employer] has completed the [facility] diabetes education. I have reached out to her and she has responded with some available dates- she will confirm the exact time and date she would like to attend.   Gentry Fitz, RN, BA, [place], [place] Direct Dial:  [phone]  Fax:  [phone] E-mail: [email or web address] [address] [place],   [address]

## 2016-02-14 DIAGNOSIS — H43313 Vitreous membranes and strands, bilateral: Secondary | ICD-10-CM | POA: Diagnosis not present

## 2016-02-14 DIAGNOSIS — H5213 Myopia, bilateral: Secondary | ICD-10-CM | POA: Diagnosis not present

## 2016-02-22 ENCOUNTER — Telehealth: Payer: Self-pay

## 2016-02-22 ENCOUNTER — Encounter: Payer: Self-pay | Admitting: *Deleted

## 2016-02-22 ENCOUNTER — Ambulatory Visit: Payer: Self-pay

## 2016-02-22 DIAGNOSIS — E1140 Type 2 diabetes mellitus with diabetic neuropathy, unspecified: Secondary | ICD-10-CM

## 2016-02-22 NOTE — Patient Outreach (Signed)
[name]) Care Management  [place]  02/22/2016   Patricia [name] September 05, 1955 [id number]  Subjective: Spoke with Patricia [name] by phone today to complete our initial Link to Wellness assessment. She was diagnosed with Type 2 diabetes in late April after it was discovered that her A1C had jumped from 6.2% to 8.2% after receiving steroids for spinal stenosis.  She was not surprised since she had had pre- diabetes and was having increased urination and increased thirst. She has also been diagnosed with fatty liver (past medical history) and has multiple complaints of intermittent joint pain (neck, shoulders, knees, feet and back) related to a bulging disk in her neck, past history of discectomy, arthritis, spinal stenosis, and peripheral neuropathy). She manages her pain with exercise and medication. Her foot pain (neuropathy) has improved greatly since she started Gabapentin but she has to alternate between 333m and 602mbecause she thinks it makes her dizzy. Her foot pain is made worse when she stands a lot and her back pain is worse with laying in bed or over working in the garden.   BrZayneells me she has lost about 20 lbs since diagnosis (245lbs) and about 30lbs from her heaviest weight(260lbs). Her current stated weight is 229lbs this am.  Her immediate goal is to get to 220lbs. She weighs daily and brings her breakfast and lunch to work with her.   BrLeslieports fasting blood sugars are 90-10241ml and 2 hour post prandial blood sugars are 105-130m38m  Encounter Medications:  Outpatient Encounter Prescriptions as of 02/22/2016  Medication Sig Note  . albuterol (PROVENTIL HFA;VENTOLIN HFA) 108 (90 Base) MCG/ACT inhaler Inhale 2 puffs into the lungs every 6 (six) hours as needed for wheezing or shortness of breath.   . Dexlansoprazole (DEXILANT) 30 MG capsule TAKE 1 CAPSULE (30 MG TOTAL) BY MOUTH ONCE DAILY.   . furosemide (LASIX) 20 MG tablet Take 20 mg by mouth daily  as needed for edema.   . gabapentin (NEURONTIN) 300 MG capsule Take 600 mg by mouth at bedtime. Alternates between 300 and 600mg56m day because it causes her to be dizzy   . Glucosamine-Chondroitin 250-200 MG TABS Take 1 tablet by mouth 2 (two) times daily.    . metFORMIN (GLUCOPHAGE) 500 MG tablet Take 500 mg by mouth 2 (two) times daily. 01/10/2016: Received from: [facility] by mouth.  . methocarbamol (ROBAXIN) 750 MG tablet Take 1 tablet by mouth at bedtime. 01/10/2016: Received from: [facility] ondansetron (ZOFRAN) 4 MG tablet Take 1 tablet (4 mg total) by mouth daily as needed.   . propranolol ER (INDERAL LA) 60 MG 24 hr capsule TAKE ONE CAPSULE BY MOUTH ONCE DAILY   . sucralfate (CARAFATE) 1 G tablet Take 1 g by mouth 4 (four) times daily -  with meals and at bedtime. Only taking 1Gram qam   . traMADol (ULTRAM) 50 MG tablet Take 50 mg by mouth every 6 (six) hours as needed. Only using about 1/2 tab once a month   . zolpidem (AMBIEN) 5 MG tablet Take 5 mg by mouth at bedtime.    No facility-administered encounter medications on file as of 02/22/2016.    Functional Status:  In your present state of health, do you have any difficulty performing the following activities: 02/22/2016  Hearing? N  Vision? N  Difficulty concentrating or making decisions? N  Walking or climbing stairs? Y  Dressing or bathing?  N  Doing errands, shopping? N    Fall/Depression Screening: PHQ 2/9 Scores 02/22/2016 01/10/2016 02/07/2015  PHQ - 2 Score 1 4 1   PHQ- 9 Score - 10 -   Plan:  [facility] CM Care Plan Problem One        Most Recent Value   Care Plan Problem One  Potential for elevated A1C   Role Documenting the Problem One  Care Management Coordinator   Care Plan for Problem One  Active   THN Long Term Goal (31-90 days)  Patient will achieve an A1C of less than 7.0% when it is checked by MD at her next visit in September 2017   [place]  Term Goal Start Date  02/22/16   Interventions for Problem One Long Term Goal  Reviewed the important of consistent exercise. Reviewed diabetes mealplanning. Reviewed the need to check sugars 2x/day to remain aware of trends.  Praised patient on taking pre-made meals to work for lunch and breakfast and eating a balanced diabetic meal for supper. Praised patient for avoiding sweetened beverages.      I will follow up with her in late August.  I have advised her to check BP when she is dizzy and call the MD if this persists.  To call me at anytime for questions. Follow up with MD in August for labs and September for visit.    Gentry Fitz, RN, BA, [place], [place] Direct Dial:  [phone]  Fax:  [phone] E-mail: [email or web address] [address] [place], [place]  [address]

## 2016-02-27 DIAGNOSIS — G4733 Obstructive sleep apnea (adult) (pediatric): Secondary | ICD-10-CM | POA: Diagnosis not present

## 2016-04-07 ENCOUNTER — Other Ambulatory Visit: Payer: Self-pay

## 2016-04-07 VITALS — BP 122/68 | Ht 62.0 in | Wt 222.5 lb

## 2016-04-07 DIAGNOSIS — E1140 Type 2 diabetes mellitus with diabetic neuropathy, unspecified: Secondary | ICD-10-CM

## 2016-04-07 NOTE — Patient Outreach (Signed)
[facility]) Care Management  [place]  04/07/2016   Patricia [name] 12-04-1955 [id number]  Subjective: Patient in for follow up visit in the Link to Wellness diabetes program.  She did not bring her meter but reports fasting blood sugars of 88-104 mg/dl and 2 hour post prandial blood sugars 98-145 mg/dl.  She reports no hypoglycemia.  She reports eating 2-3 meals per day and 2 snacks, but sometimes she skips lunch, especially on the weekends.  She tells me she is documenting her calories each day and is only eating about 1000 kcal/day.  She reports exercising 20-30 minutes per day, 3 days per week plus she swims and works in her garden.   Objective:  Vitals:   04/07/16 1340  BP: 122/68  Weight: 222 lb 8 oz (100.9 kg)  Height: 1.575 m (5' 2" )     Encounter Medications:  Outpatient Encounter Prescriptions as of 04/07/2016  Medication Sig Note  . albuterol (PROVENTIL HFA;VENTOLIN HFA) 108 (90 Base) MCG/ACT inhaler Inhale 2 puffs into the lungs every 6 (six) hours as needed for wheezing or shortness of breath.   . Dexlansoprazole (DEXILANT) 30 MG capsule TAKE 1 CAPSULE (30 MG TOTAL) BY MOUTH ONCE DAILY.   . furosemide (LASIX) 20 MG tablet Take 20 mg by mouth daily as needed for edema.   . gabapentin (NEURONTIN) 300 MG capsule Take 600 mg by mouth at bedtime. Alternates between 300 and 669m per day because it causes her to be dizzy   . Glucosamine-Chondroitin 250-200 MG TABS Take 1 tablet by mouth 2 (two) times daily.    . metFORMIN (GLUCOPHAGE) 500 MG tablet Take 500 mg by mouth 2 (two) times daily. 01/10/2016: [facility]ceived [facility]: [facility] by mouth.  . methocarbamol (ROBAXIN) 750 MG tablet Take 1 tablet by mouth at bedtime. 01/10/2016: Received from: [facility]   . ondansetron (ZOFRAN) 4 MG tablet Take 1 tablet (4 mg total) by mouth daily as needed.   . propranolol ER (INDERAL LA) 60 MG 24 hr  capsule TAKE ONE CAPSULE BY MOUTH ONCE DAILY   . sucralfate (CARAFATE) 1 G tablet Take 1 g by mouth 4 (four) times daily -  with meals and at bedtime. Only taking 1Gram qam   . traMADol (ULTRAM) 50 MG tablet Take 50 mg by mouth every 6 (six) hours as needed. Only using about 1/2 tab once a month   . zolpidem (AMBIEN) 5 MG tablet Take 5 mg by mouth at bedtime.    No facility-administered encounter medications on file as of 04/07/2016.     Functional Status:  In your present state of health, do you have any difficulty performing the following activities: 02/22/2016  Hearing? N  Vision? N  Difficulty concentrating or making decisions? N  Walking or climbing stairs? Y  Dressing or bathing? N  Doing errands, shopping? N  Some recent data might be hidden    Fall/Depression Screening: PHQ 2/9 Scores 02/22/2016 01/10/2016 02/07/2015  PHQ - 2 Score 1 4 1   PHQ- 9 Score - 10 -    Assessment: In [name]'s attempt to improve her health, I believe she may be eating too few calories, she also admits that she is getting tired of eating the same kinds of food. The dietitian told her the least number of calories she should aim for is 1200 kcal. She is occasionally skipping meals- a behavior, not recommended for people who have diabetes; it may  increase the risk of hypoglycemia.  She is more engaged in exercise than she has been in the past.   Plan:   [facility] CM Care Plan Problem One   Flowsheet Row Most Recent Value  THN Long Term Goal (31-90 days)  (P) Patient will achieve an A1C of less than 7.0% when it is checked by MD at her next visit in September 2017  [place] Term Goal Start Date  (P) 02/22/16  Interventions for Problem One Long Term Goal  (P) Reviewed the important of consistent exercise. Reviewed diabetes mealplanning. Reviewed the need to check sugars 2x/day to remain aware of trends.  Praised patient on taking pre-made meals to work for lunch and breakfast and eating a balanced diabetic meal for  supper. Praised patient for avoiding sweetened beverages.      Follow up with MD on 05/09/16.     Gentry Fitz, RN, BA, [place], [place] Direct Dial:  [phone]  Fax:  [phone] E-mail: [email or web address] [address] [place], [place]  [address]

## 2016-05-02 DIAGNOSIS — E119 Type 2 diabetes mellitus without complications: Secondary | ICD-10-CM | POA: Diagnosis not present

## 2016-05-09 DIAGNOSIS — F5104 Psychophysiologic insomnia: Secondary | ICD-10-CM | POA: Diagnosis not present

## 2016-05-09 DIAGNOSIS — E119 Type 2 diabetes mellitus without complications: Secondary | ICD-10-CM | POA: Diagnosis not present

## 2016-05-09 DIAGNOSIS — K760 Fatty (change of) liver, not elsewhere classified: Secondary | ICD-10-CM | POA: Diagnosis not present

## 2016-05-09 DIAGNOSIS — I10 Essential (primary) hypertension: Secondary | ICD-10-CM | POA: Diagnosis not present

## 2016-05-14 ENCOUNTER — Other Ambulatory Visit: Payer: Self-pay

## 2016-05-14 NOTE — Patient Outreach (Signed)
[facility]) Care Management  05/14/2016  Patricia [name] 1955/10/08 [id number]   Spoke with patient by phone today regarding her recent visit with MD and A1C of 5.6%.  She tells me she has lost weight after changing her diet and being active in her garden.  MD has decreased her Metformin to ER 550m once a day- Patricia [name] it at lunchtime.    [name]  BShandiinreports fasting blood sugars 78-954mdl and 2hr post prandial blood sugars 115-12863ml.  She denies hypo or hyperglycemia.   Patricia [name]   Flowsheet Row Most Recent Value  Care Plan Problem [name]  Potential for elevated A1C  Role Documenting the Problem [name]  Care Management Coordinator  Care Plan for Problem [name]  Not Active  Patricia [name] Long Term Goal (31-90 days)  Patient will achieve an A1C of less than 7.0% when it is checked by MD at her next visit in September 2017  [facility] Goal Start Date  04/07/16  [facility] Met [facility]  05/14/16    Patricia [name]   Flowsheet Row Most Recent Value  Care Plan Problem [name]  Potential for elevated A1C greater than 7%  Role Documenting the Problem [name]  Care Management Coordinator  Care Plan for Problem [name]  Active  Interventions for Problem [name] Long Term Goal   Praised for exercise habits and decreased dietary intake with ongoing weight loss.  2. encouraged Patricia [name] a alternative "exercise plan" for when the weather gets cool.   Patricia [name] Long Term Goal (31-90) days  Patient  will maintain an A1C under 7% when it it is checked by a health care provider in the next 3 months  Patricia [name] Long Term Goal Start Date  05/14/16      I have encouraged her to plan/determine an exercise plan as the weather gets cooler and BreAnjali longer has the garden to work in.   JulSYSCON,Therapist, sportsA,[phone]:  [phone]: [email or web address] [address]  [phone]

## 2016-05-23 DIAGNOSIS — L02211 Cutaneous abscess of abdominal wall: Secondary | ICD-10-CM | POA: Diagnosis not present

## 2016-06-03 DIAGNOSIS — G4733 Obstructive sleep apnea (adult) (pediatric): Secondary | ICD-10-CM | POA: Diagnosis not present

## 2016-06-30 DIAGNOSIS — K740 Hepatic fibrosis: Secondary | ICD-10-CM | POA: Diagnosis not present

## 2016-06-30 DIAGNOSIS — K760 Fatty (change of) liver, not elsewhere classified: Secondary | ICD-10-CM | POA: Diagnosis not present

## 2016-08-15 DIAGNOSIS — E119 Type 2 diabetes mellitus without complications: Secondary | ICD-10-CM | POA: Diagnosis not present

## 2016-08-22 ENCOUNTER — Telehealth: Payer: Self-pay | Admitting: *Deleted

## 2016-08-22 ENCOUNTER — Other Ambulatory Visit: Payer: Self-pay | Admitting: Internal Medicine

## 2016-08-22 DIAGNOSIS — Z1231 Encounter for screening mammogram for malignant neoplasm of breast: Secondary | ICD-10-CM | POA: Diagnosis not present

## 2016-08-22 DIAGNOSIS — E119 Type 2 diabetes mellitus without complications: Secondary | ICD-10-CM | POA: Diagnosis not present

## 2016-08-22 DIAGNOSIS — F17200 Nicotine dependence, unspecified, uncomplicated: Secondary | ICD-10-CM | POA: Diagnosis not present

## 2016-08-22 DIAGNOSIS — Z124 Encounter for screening for malignant neoplasm of cervix: Secondary | ICD-10-CM | POA: Diagnosis not present

## 2016-08-22 DIAGNOSIS — Z0000 Encounter for general adult medical examination without abnormal findings: Secondary | ICD-10-CM | POA: Diagnosis not present

## 2016-08-22 DIAGNOSIS — Z87891 Personal history of nicotine dependence: Secondary | ICD-10-CM

## 2016-08-22 HISTORY — DX: Nicotine dependence, unspecified, uncomplicated: F17.200

## 2016-08-22 NOTE — Telephone Encounter (Signed)
Received referral for initial lung cancer screening scan. Contacted patient and obtained smoking history,(current, 30 pack year) as well as answering questions related to screening process. Patient denies signs of lung cancer such as weight loss or hemoptysis. Patient denies comorbidity that would prevent curative treatment if lung cancer were found. Patient requests this be scheduled after 09/08/16.

## 2016-09-08 DIAGNOSIS — G4733 Obstructive sleep apnea (adult) (pediatric): Secondary | ICD-10-CM | POA: Diagnosis not present

## 2016-09-09 ENCOUNTER — Inpatient Hospital Stay: Payer: 59 | Attending: Oncology | Admitting: Oncology

## 2016-09-09 ENCOUNTER — Encounter: Payer: Self-pay | Admitting: Oncology

## 2016-09-09 DIAGNOSIS — Z122 Encounter for screening for malignant neoplasm of respiratory organs: Secondary | ICD-10-CM | POA: Diagnosis not present

## 2016-09-09 DIAGNOSIS — F17210 Nicotine dependence, cigarettes, uncomplicated: Secondary | ICD-10-CM | POA: Diagnosis not present

## 2016-09-10 ENCOUNTER — Ambulatory Visit
Admission: RE | Admit: 2016-09-10 | Discharge: 2016-09-10 | Disposition: A | Discharge status: Home / Self Care | Payer: 59 | Source: Ambulatory Visit | Attending: Oncology | Admitting: Oncology

## 2016-09-10 DIAGNOSIS — Z87891 Personal history of nicotine dependence: Secondary | ICD-10-CM | POA: Diagnosis not present

## 2016-09-10 DIAGNOSIS — Z122 Encounter for screening for malignant neoplasm of respiratory organs: Secondary | ICD-10-CM | POA: Insufficient documentation

## 2016-09-10 DIAGNOSIS — K760 Fatty (change of) liver, not elsewhere classified: Secondary | ICD-10-CM | POA: Diagnosis not present

## 2016-09-10 DIAGNOSIS — I700 Atherosclerosis of aorta: Secondary | ICD-10-CM | POA: Diagnosis not present

## 2016-09-10 DIAGNOSIS — I2510 Atherosclerotic heart disease of native coronary artery without angina pectoris: Secondary | ICD-10-CM | POA: Diagnosis not present

## 2016-09-10 DIAGNOSIS — J439 Emphysema, unspecified: Secondary | ICD-10-CM | POA: Insufficient documentation

## 2016-09-11 ENCOUNTER — Telehealth: Payer: Self-pay | Admitting: *Deleted

## 2016-09-11 NOTE — Telephone Encounter (Signed)
Notified patient of LDCT lung cancer screening results with recommendation for 12 month follow up imaging. Also notified of incidental finding noted below, and is encouraged to discuss with PCP. Patient verbalizes understanding. This note will be forwarded to PCP via Epic.   IMPRESSION: 1. Lung-RADS Category 1S, negative. Continue annual screening with low-dose chest CT without contrast in 12 months. 2. The "S" modifier above refers to potentially clinically significant non lung cancer related findings. Specifically, there is a aortic atherosclerosis, in addition to left anterior descending coronary artery disease. Please note that although the presence of coronary artery calcium documents the presence of coronary artery disease, the severity of this disease and any potential stenosis cannot be assessed on this non-gated CT examination. Assessment for potential risk factor modification, dietary therapy or pharmacologic therapy may be warranted, if clinically indicated. 3. Mild diffuse bronchial wall thickening with very mild centrilobular and paraseptal emphysema; imaging findings suggestive of underlying COPD. 4. Hepatic steatosis.

## 2016-09-12 DIAGNOSIS — Z87891 Personal history of nicotine dependence: Secondary | ICD-10-CM | POA: Insufficient documentation

## 2016-09-12 NOTE — Progress Notes (Signed)
In accordance with CMS guidelines, patient has met eligibility criteria including age, absence of signs or symptoms of lung cancer.  Social History  Substance Use Topics  . Smoking status: Current Every Day Smoker    Packs/day: 1.00    Years: 30.00    Types: Cigarettes  . Smokeless tobacco: Never Used     Comment: has quit in the past multiple times  . Alcohol use No     A shared decision-making session was conducted prior to the performance of CT scan. This includes one or more decision aids, includes benefits and harms of screening, follow-up diagnostic testing, over-diagnosis, false positive rate, and total radiation exposure.  Counseling on the importance of adherence to annual lung cancer LDCT screening, impact of co-morbidities, and ability or willingness to undergo diagnosis and treatment is imperative for compliance of the program.  Counseling on the importance of continued smoking cessation for former smokers; the importance of smoking cessation for current smokers, and information about tobacco cessation interventions have been given to patient including [place] and 1800 quit [place] programs.  Written order for lung cancer screening with LDCT has been given to the patient and any and all questions have been answered to the best of my abilities.   Yearly follow up will be coordinated by Burgess Estelle, Thoracic Navigator.

## 2016-09-23 DIAGNOSIS — H43313 Vitreous membranes and strands, bilateral: Secondary | ICD-10-CM | POA: Diagnosis not present

## 2016-09-23 DIAGNOSIS — E119 Type 2 diabetes mellitus without complications: Secondary | ICD-10-CM | POA: Diagnosis not present

## 2016-10-08 ENCOUNTER — Other Ambulatory Visit: Payer: Self-pay | Admitting: Internal Medicine

## 2016-10-08 DIAGNOSIS — Z1231 Encounter for screening mammogram for malignant neoplasm of breast: Secondary | ICD-10-CM

## 2016-11-06 ENCOUNTER — Ambulatory Visit
Admission: RE | Admit: 2016-11-06 | Discharge: 2016-11-06 | Disposition: A | Discharge status: Home / Self Care | Payer: 59 | Source: Ambulatory Visit | Attending: Internal Medicine | Admitting: Internal Medicine

## 2016-11-06 DIAGNOSIS — Z1231 Encounter for screening mammogram for malignant neoplasm of breast: Secondary | ICD-10-CM | POA: Insufficient documentation

## 2016-11-12 DIAGNOSIS — L304 Erythema intertrigo: Secondary | ICD-10-CM | POA: Diagnosis not present

## 2016-11-12 DIAGNOSIS — L2089 Other atopic dermatitis: Secondary | ICD-10-CM | POA: Diagnosis not present

## 2016-11-17 DIAGNOSIS — E119 Type 2 diabetes mellitus without complications: Secondary | ICD-10-CM | POA: Diagnosis not present

## 2016-11-24 DIAGNOSIS — K760 Fatty (change of) liver, not elsewhere classified: Secondary | ICD-10-CM | POA: Diagnosis not present

## 2016-11-24 DIAGNOSIS — F5104 Psychophysiologic insomnia: Secondary | ICD-10-CM | POA: Diagnosis not present

## 2016-11-24 DIAGNOSIS — I10 Essential (primary) hypertension: Secondary | ICD-10-CM | POA: Diagnosis not present

## 2016-11-24 DIAGNOSIS — E119 Type 2 diabetes mellitus without complications: Secondary | ICD-10-CM | POA: Diagnosis not present

## 2016-12-03 DIAGNOSIS — L2089 Other atopic dermatitis: Secondary | ICD-10-CM | POA: Diagnosis not present

## 2016-12-15 DIAGNOSIS — G4733 Obstructive sleep apnea (adult) (pediatric): Secondary | ICD-10-CM | POA: Diagnosis not present

## 2016-12-23 IMAGING — MG MM DIGITAL SCREENING BILAT W/ TOMO W/ CAD
8 of 12 series · 8 of 28 positions shown · non-contrast
Comparison: Previous exam(s).

CLINICAL DATA: Screening.

EXAM:
DIGITAL SCREENING BILATERAL MAMMOGRAM WITH 3D TOMO WITH CAD

[L CC]
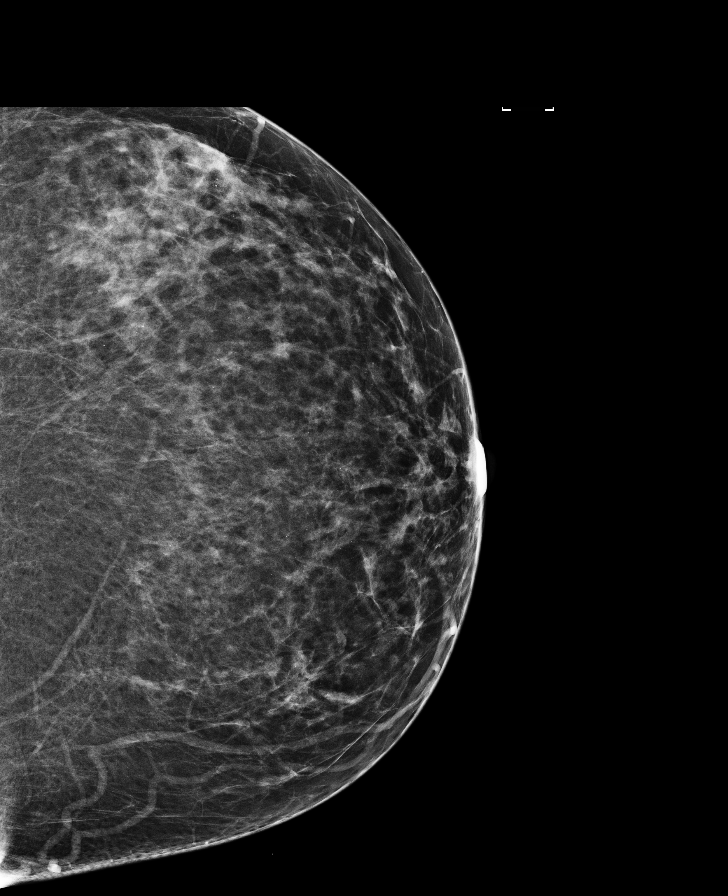

[R CC synth-2D]
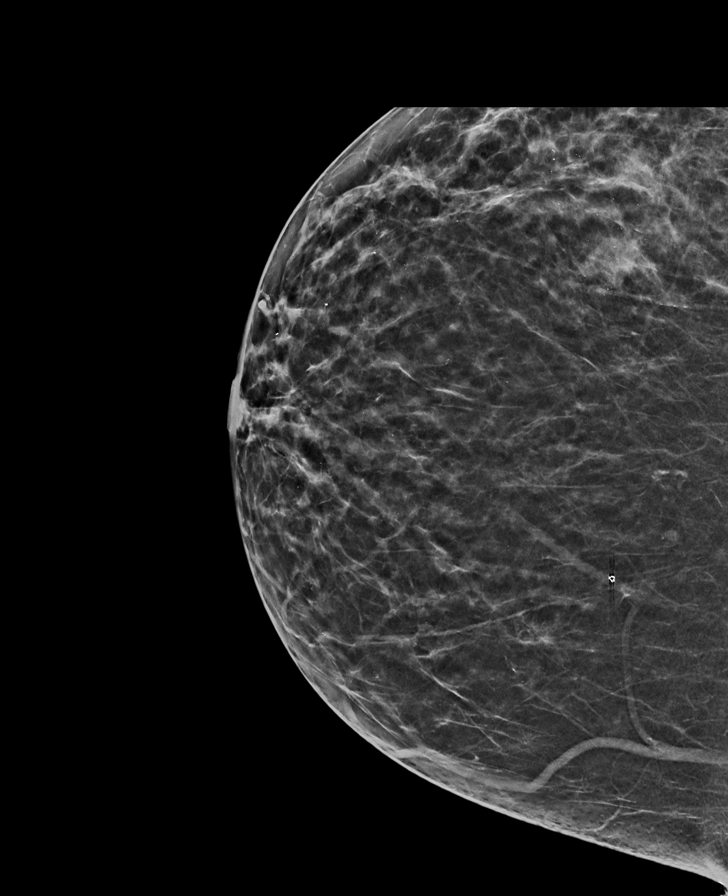

[L MLO]
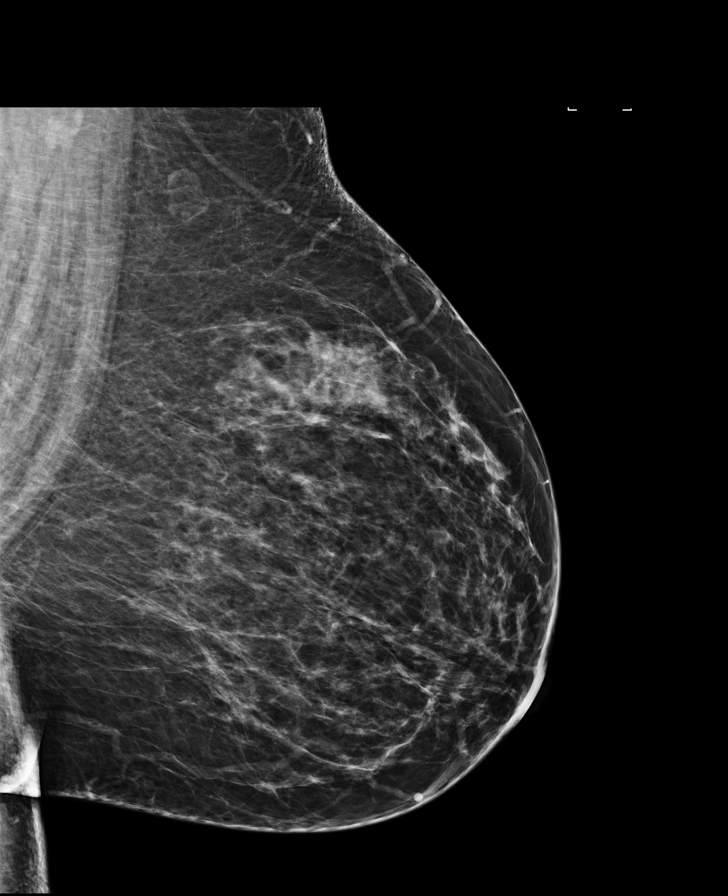

[R MLO]
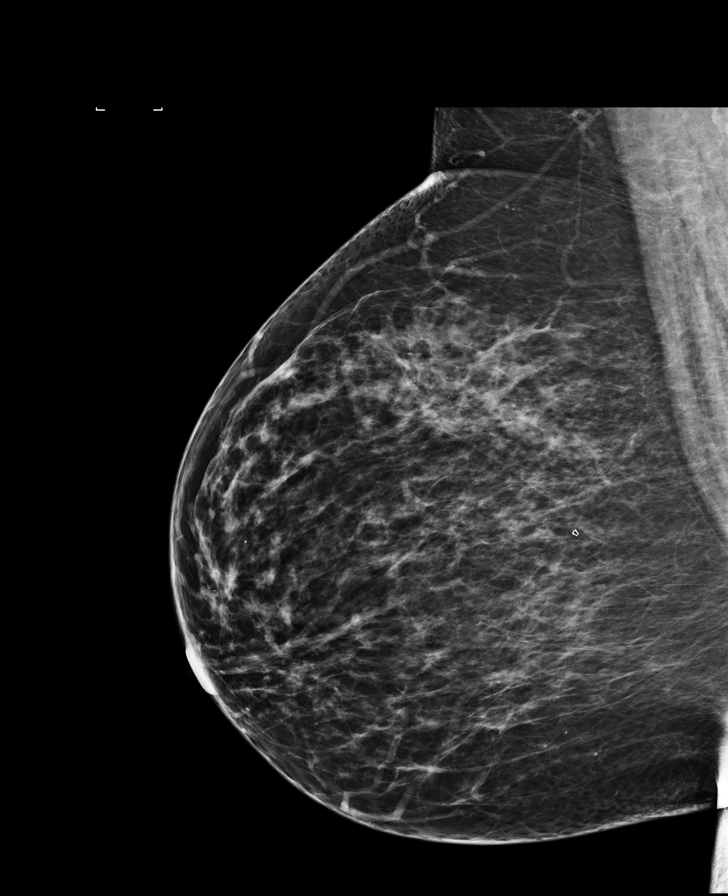

[R CC]
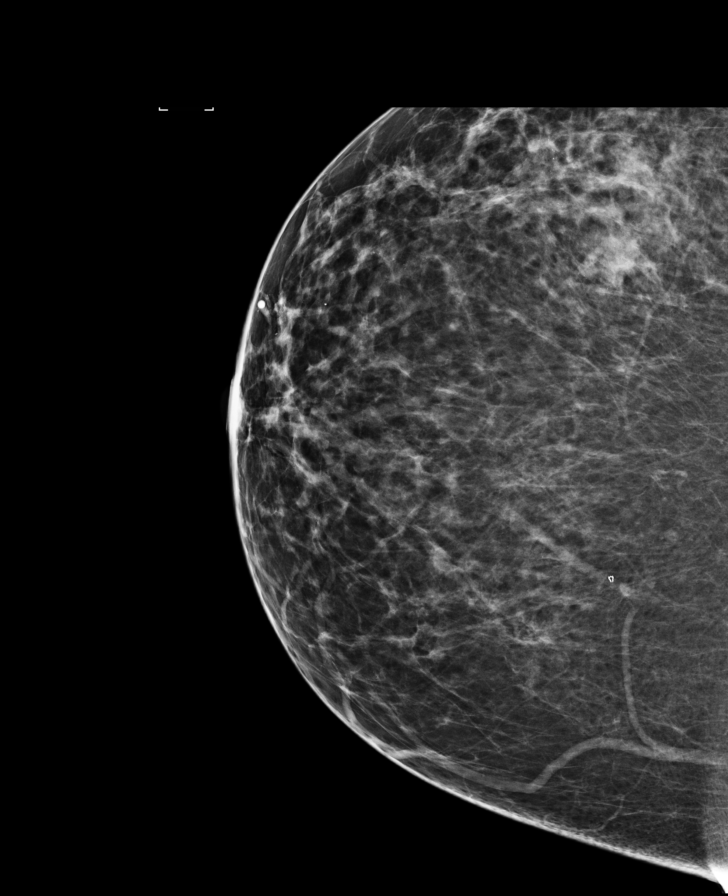

[L CC synth-2D]
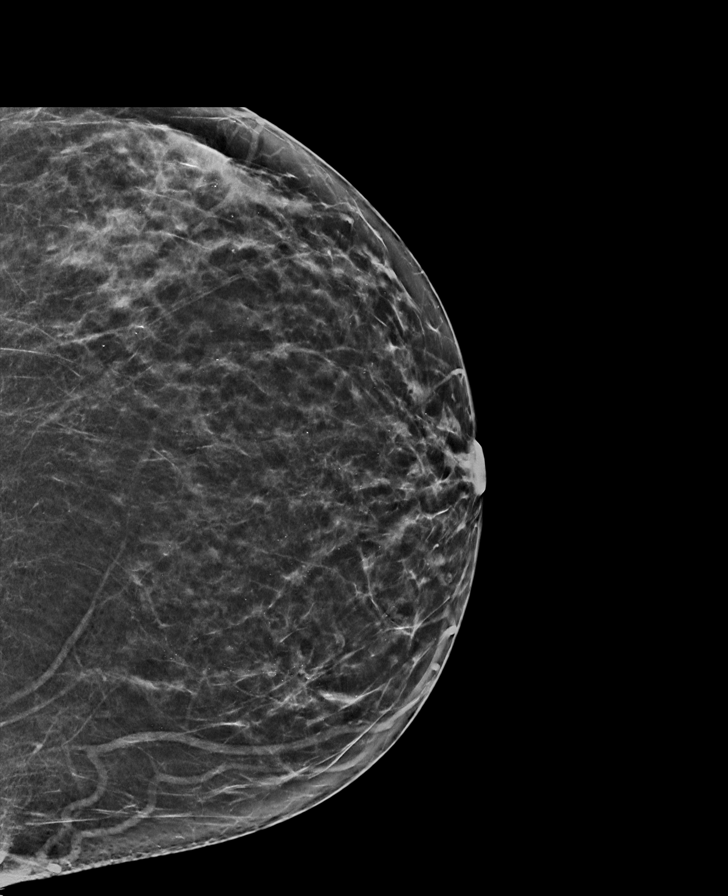

[R MLO synth-2D]
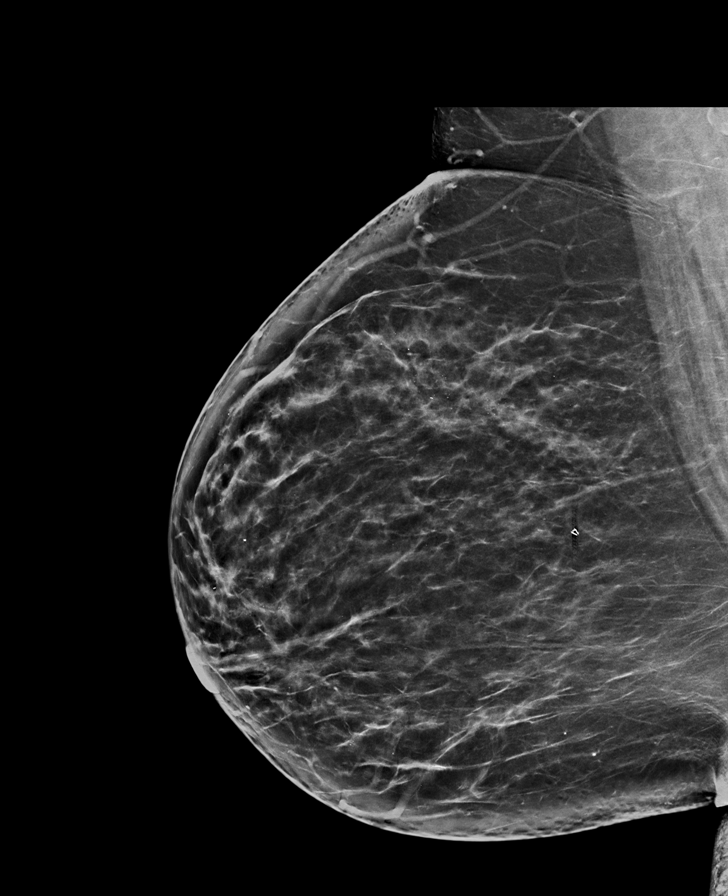

[L MLO synth-2D]
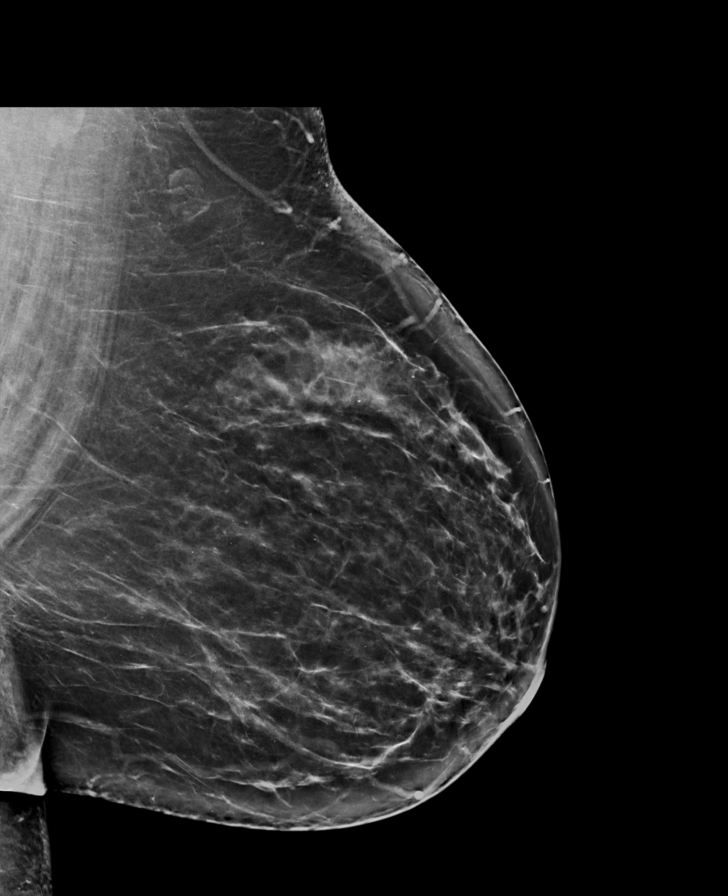

[8 of 28 positions shown; findings below may reference images not displayed]

ACR Breast Density Category b: There are scattered areas of
fibroglandular density.
FINDINGS: There are no findings suspicious for malignancy. Images were
processed with CAD.
IMPRESSION: No mammographic evidence of malignancy. A result letter of this
screening mammogram will be mailed directly to the patient.

RECOMMENDATION:
Screening mammogram in one year. (Code:55-L-23V)

BI-RADS CATEGORY  1: Negative.

## 2017-01-01 DIAGNOSIS — K760 Fatty (change of) liver, not elsewhere classified: Secondary | ICD-10-CM | POA: Diagnosis not present

## 2017-01-01 DIAGNOSIS — K740 Hepatic fibrosis: Secondary | ICD-10-CM | POA: Diagnosis not present

## 2017-02-17 DIAGNOSIS — I10 Essential (primary) hypertension: Secondary | ICD-10-CM | POA: Diagnosis not present

## 2017-02-17 DIAGNOSIS — E119 Type 2 diabetes mellitus without complications: Secondary | ICD-10-CM | POA: Diagnosis not present

## 2017-02-18 IMAGING — CR DG CHEST 2V
1 series · 2 of 2 positions shown · non-contrast
Comparison: 04/23/2007

CLINICAL DATA: 60-year-old female with a history of chest pain.

EXAM:
CHEST - 2 VIEW

[Series 1: dg chest 2 view · 0.14mm/px · 2 of 2 slices shown]
[im 1/2]
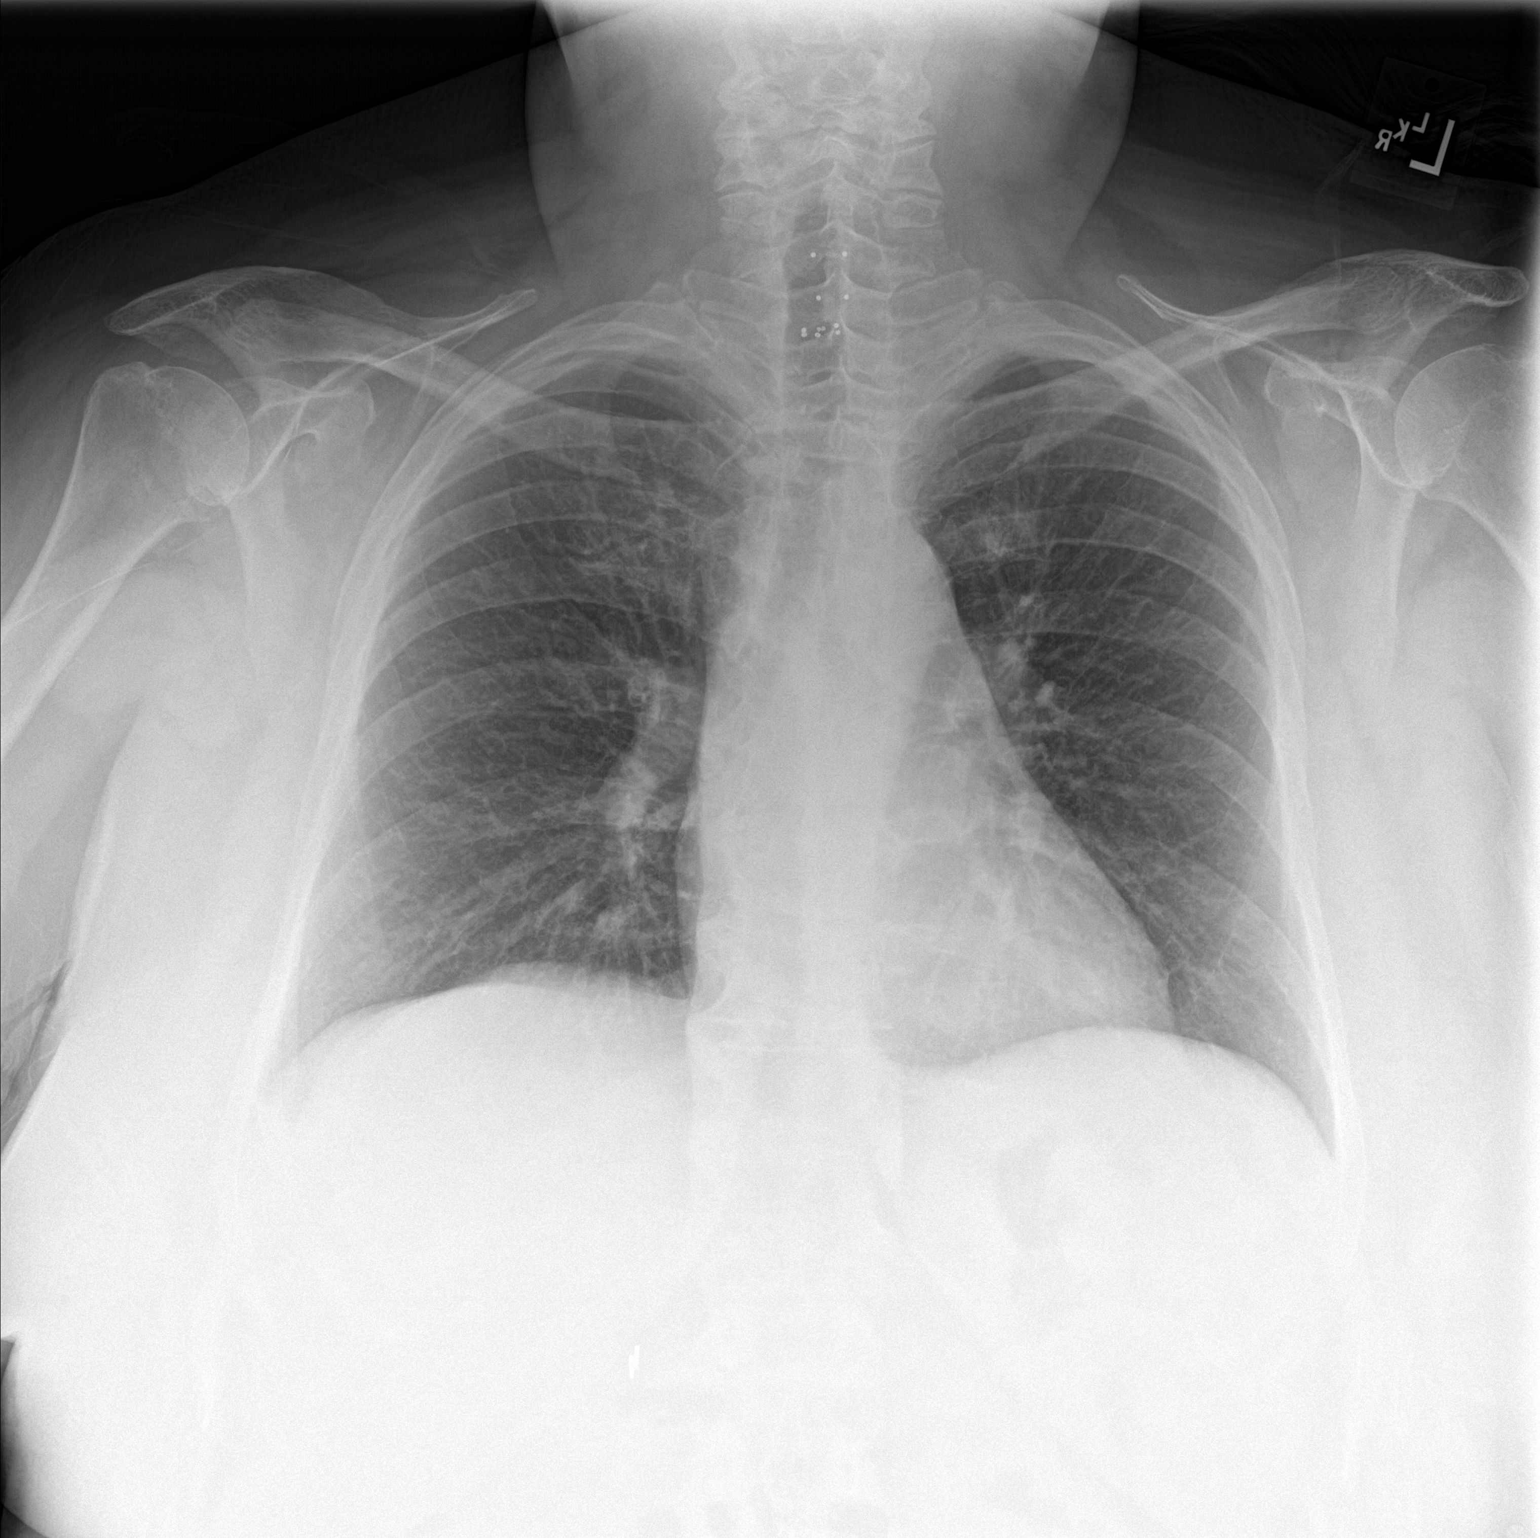
[im 2/2]
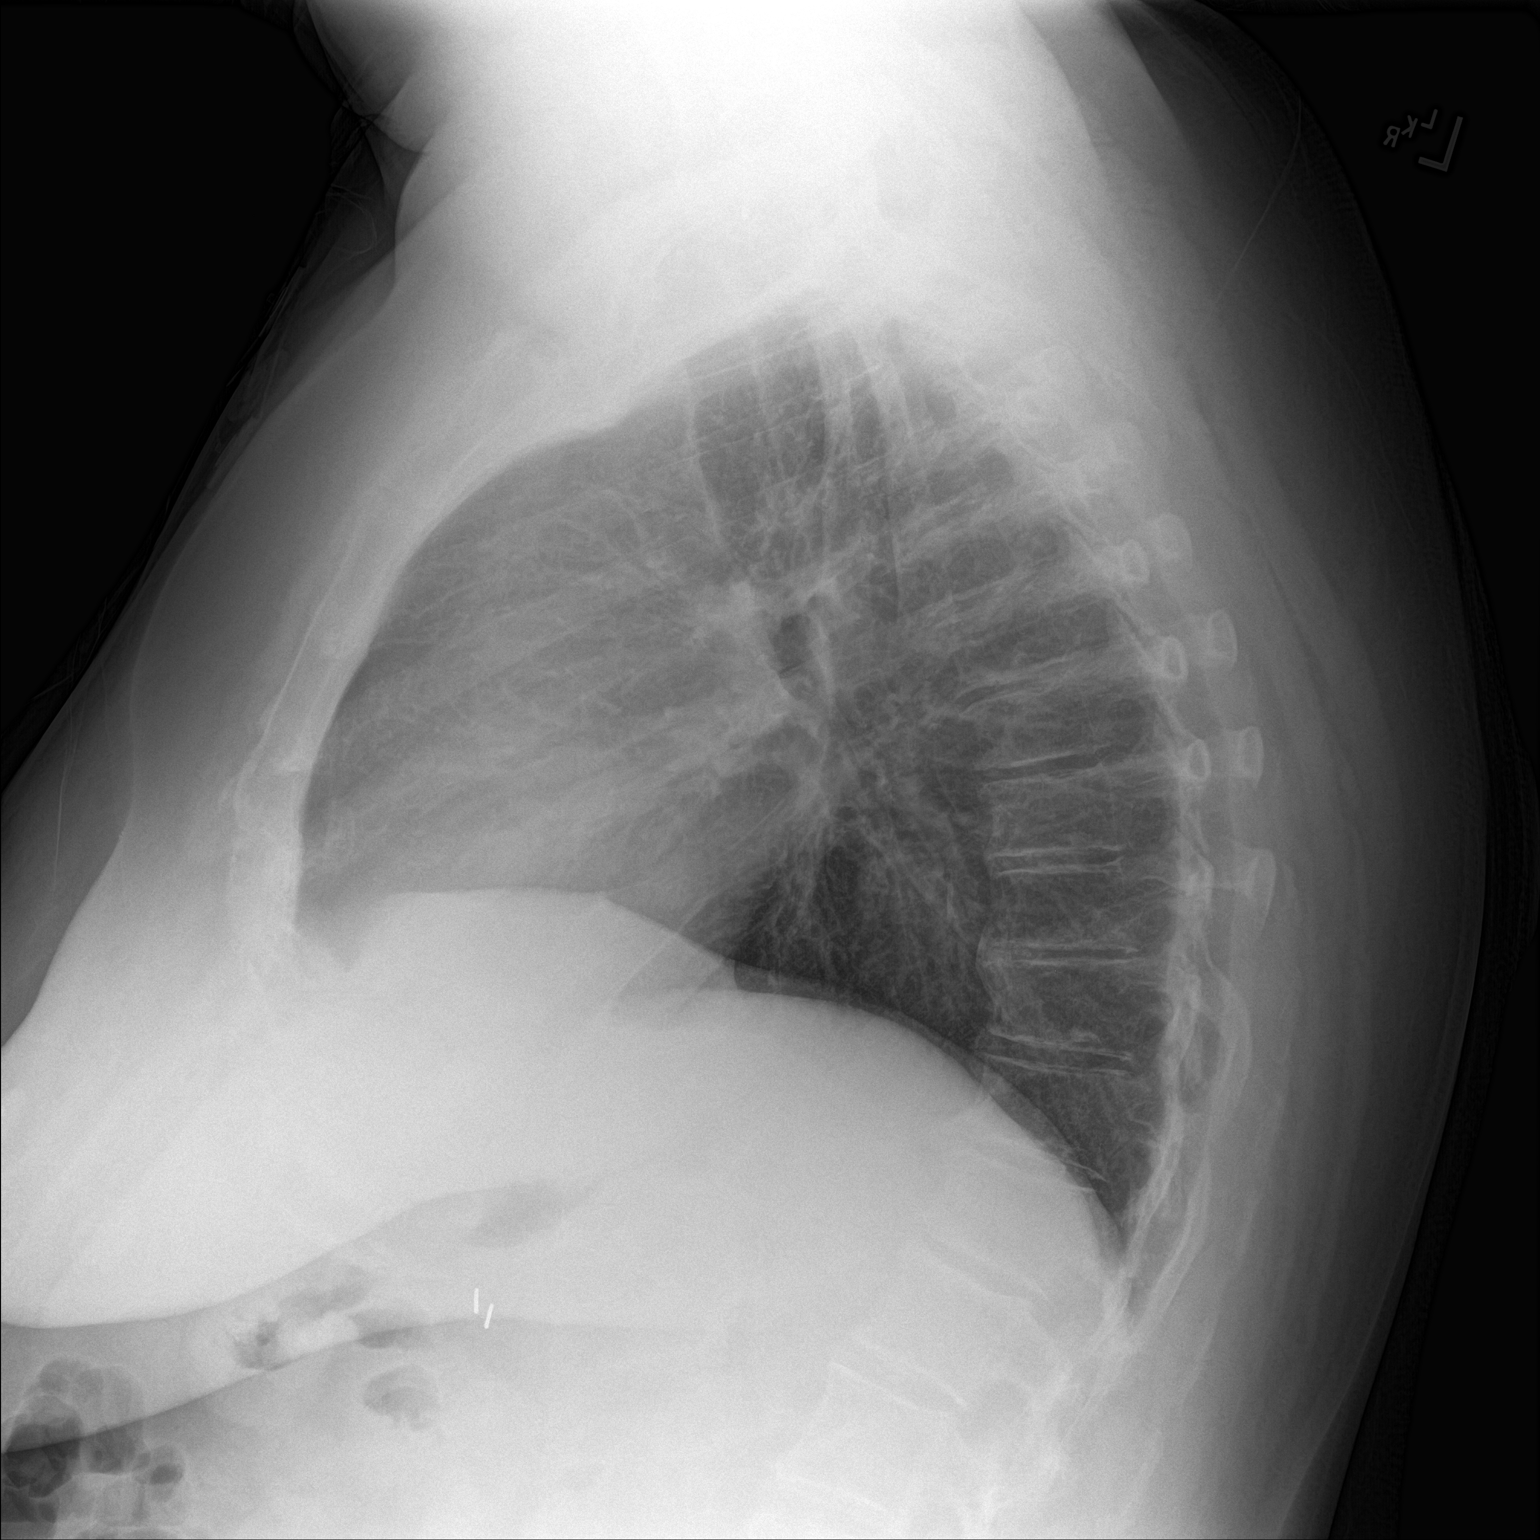

[2 of 2 positions shown; findings below may reference images not displayed]

FINDINGS: Cardiomediastinal silhouette projects within normal limits in size
and contour. No confluent airspace disease, pneumothorax, or pleural
effusion.

No displaced fracture.

Cholecystectomy

Surgical changes of the cervical region.
IMPRESSION: No radiographic evidence of acute cardiopulmonary disease.

## 2017-02-25 ENCOUNTER — Other Ambulatory Visit: Payer: Self-pay | Admitting: Internal Medicine

## 2017-02-25 DIAGNOSIS — R42 Dizziness and giddiness: Secondary | ICD-10-CM

## 2017-02-25 DIAGNOSIS — F17200 Nicotine dependence, unspecified, uncomplicated: Secondary | ICD-10-CM | POA: Diagnosis not present

## 2017-02-25 DIAGNOSIS — I10 Essential (primary) hypertension: Secondary | ICD-10-CM | POA: Diagnosis not present

## 2017-02-25 DIAGNOSIS — E119 Type 2 diabetes mellitus without complications: Secondary | ICD-10-CM | POA: Diagnosis not present

## 2017-03-04 ENCOUNTER — Ambulatory Visit
Admission: RE | Admit: 2017-03-04 | Discharge: 2017-03-04 | Disposition: A | Discharge status: Home / Self Care | Payer: 59 | Source: Ambulatory Visit | Attending: Internal Medicine | Admitting: Internal Medicine

## 2017-03-04 DIAGNOSIS — R42 Dizziness and giddiness: Secondary | ICD-10-CM | POA: Insufficient documentation

## 2017-03-06 DIAGNOSIS — R42 Dizziness and giddiness: Secondary | ICD-10-CM | POA: Diagnosis not present

## 2017-03-24 DIAGNOSIS — E119 Type 2 diabetes mellitus without complications: Secondary | ICD-10-CM | POA: Diagnosis not present

## 2017-03-24 DIAGNOSIS — H43313 Vitreous membranes and strands, bilateral: Secondary | ICD-10-CM | POA: Diagnosis not present

## 2017-03-30 DIAGNOSIS — G4733 Obstructive sleep apnea (adult) (pediatric): Secondary | ICD-10-CM | POA: Diagnosis not present

## 2017-04-30 IMAGING — US US ABDOMEN LIMITED
1 series · 14 of 25 positions shown · non-contrast
Comparison: Ultrasound July 18, 2015.

CLINICAL DATA: Chronic right upper quadrant abdominal pain.

EXAM:
US ABDOMEN LIMITED - RIGHT UPPER QUADRANT

[Series 1: us abdomen limited · 0.28mm/px · 14 of 43 slices shown]
[im 1/43]
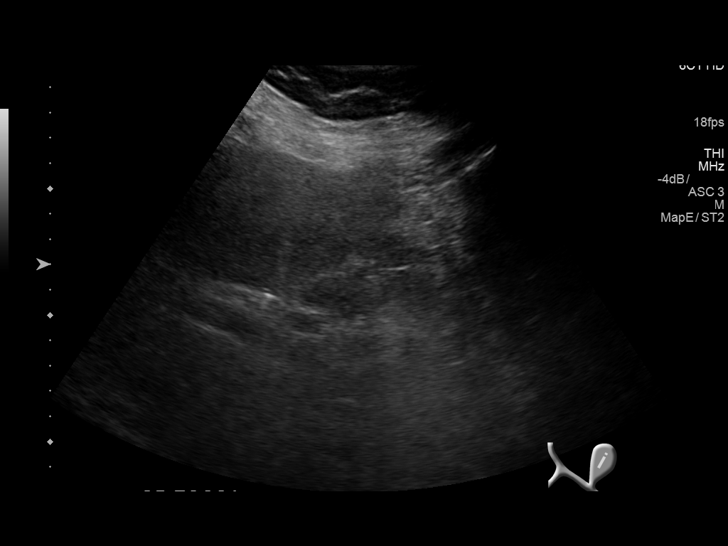
[im 4/43]
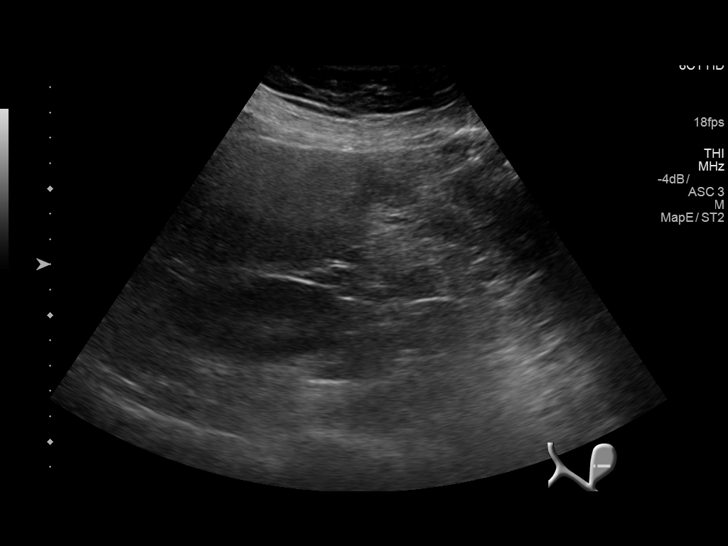
[im 8/43]
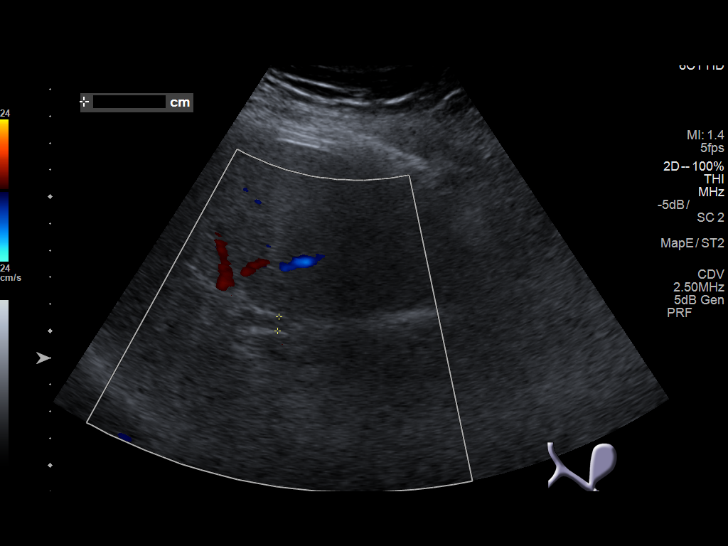
[im 11/43]
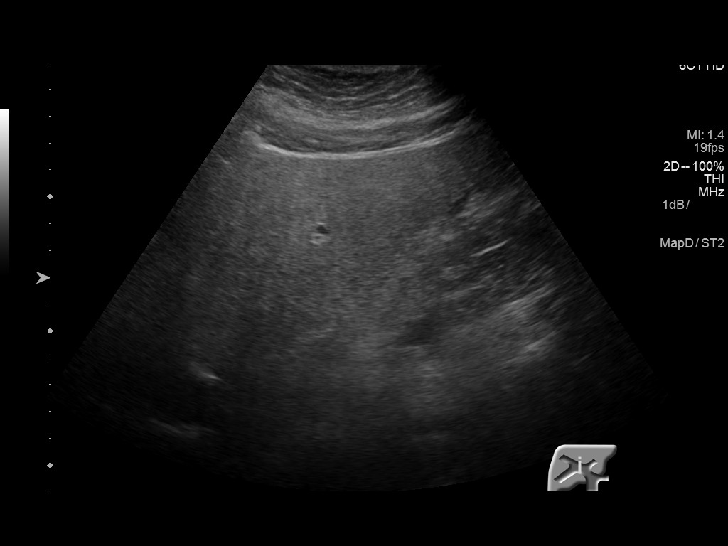
[im 15/43]
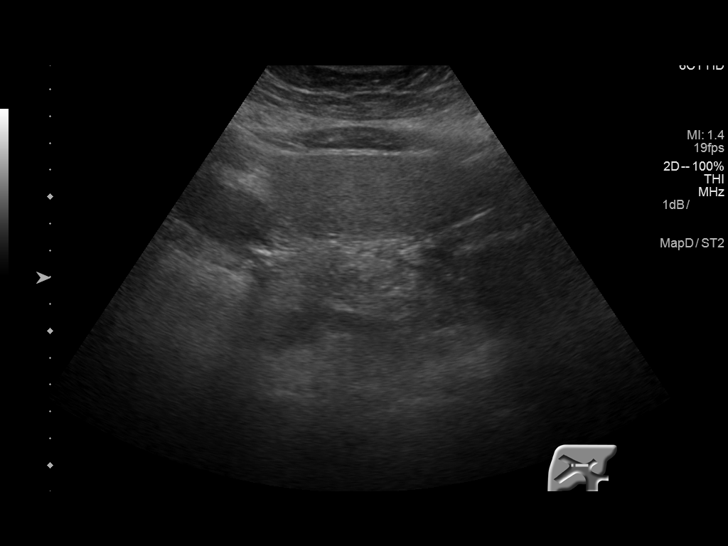
[im 16/43]
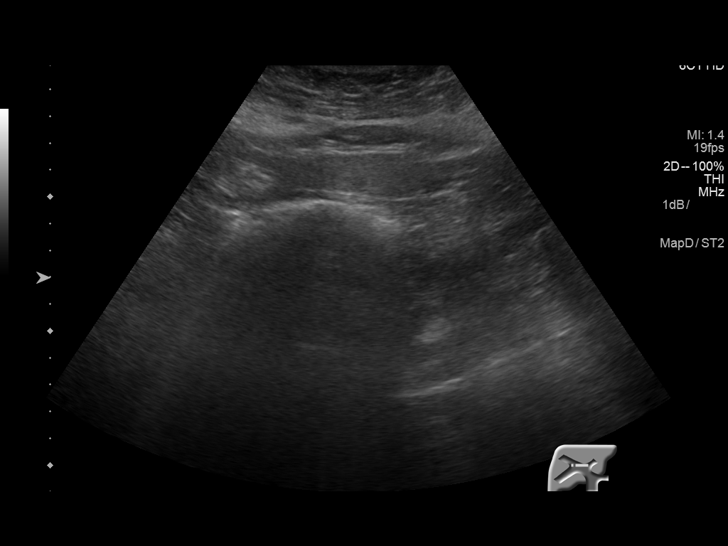
[im 20/43]
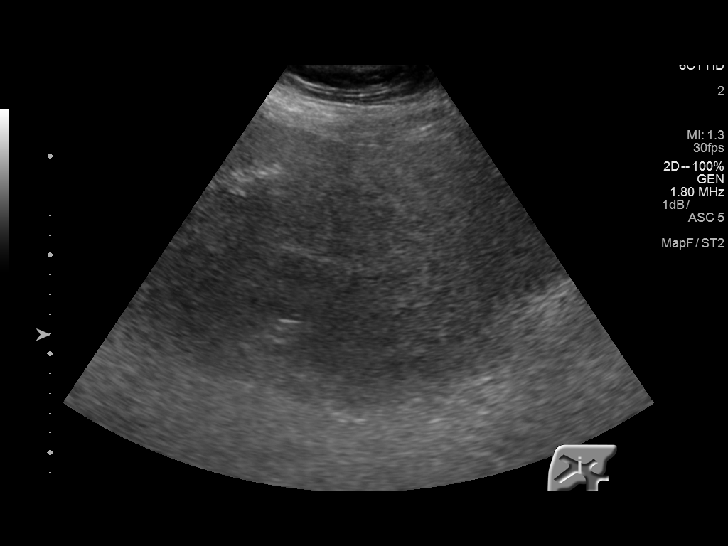
[im 23/43]
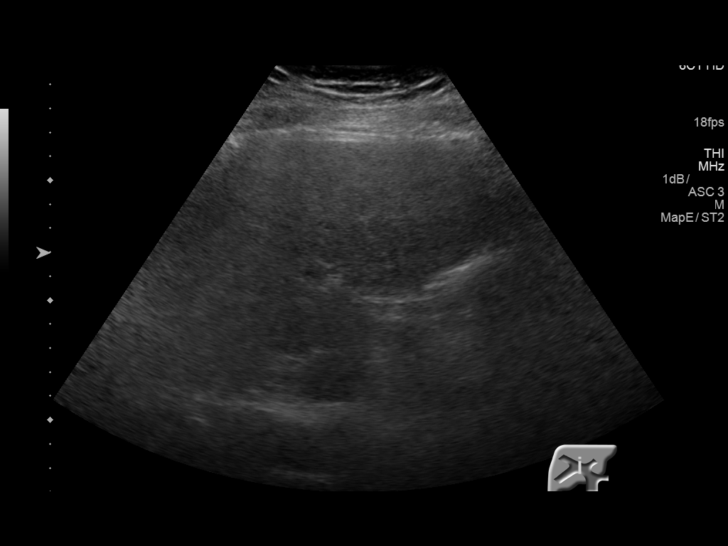
[im 27/43]
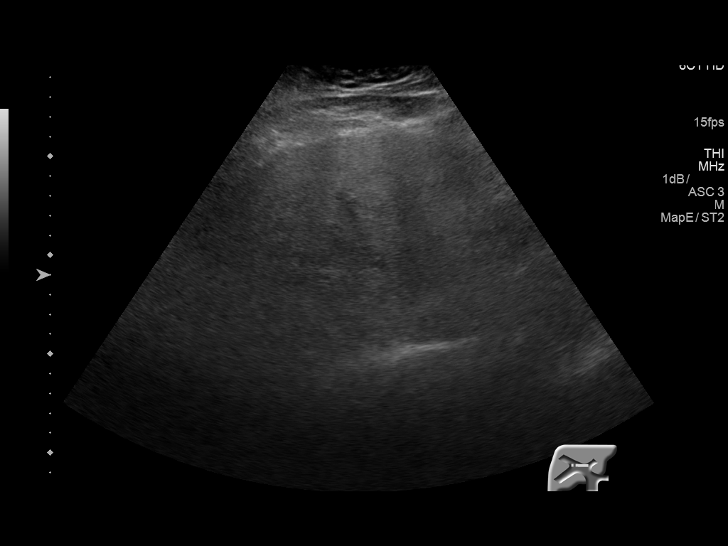
[im 29/43]
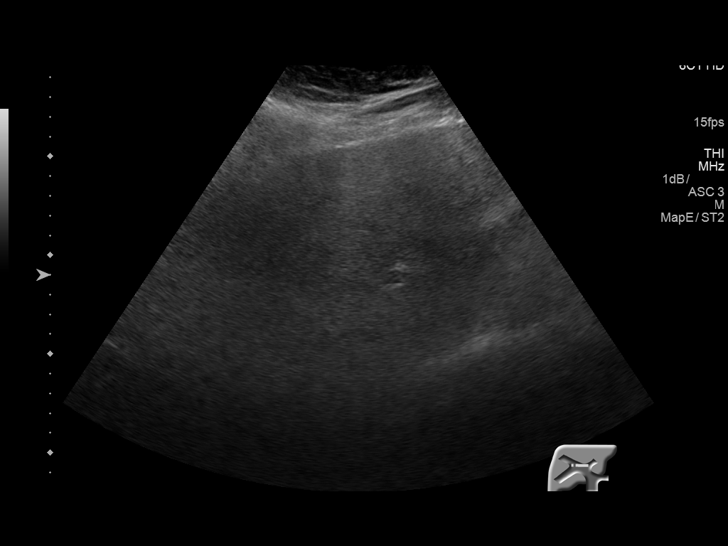
[im 32/43]
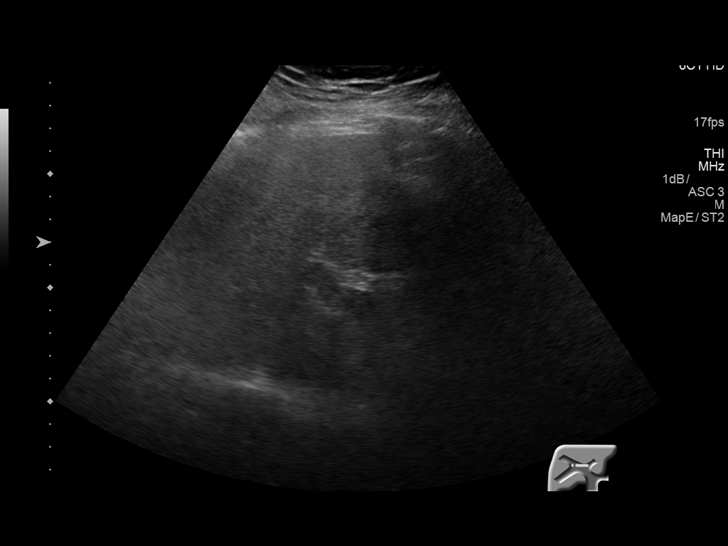
[im 36/43]
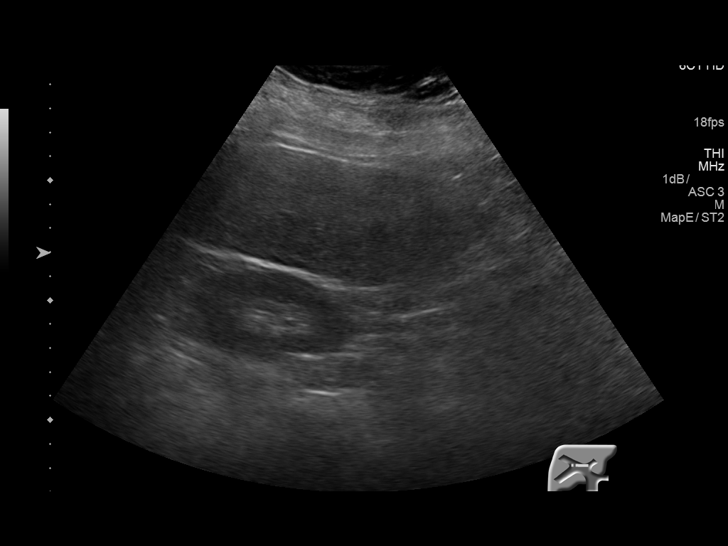
[im 39/43]
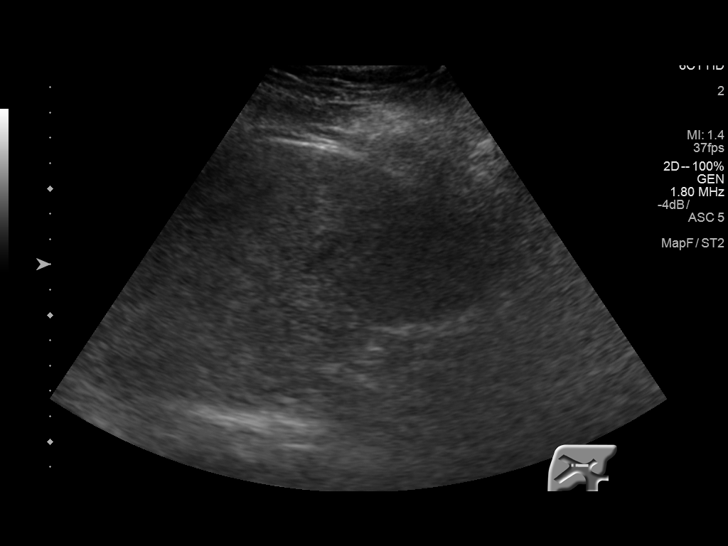
[im 43/43]
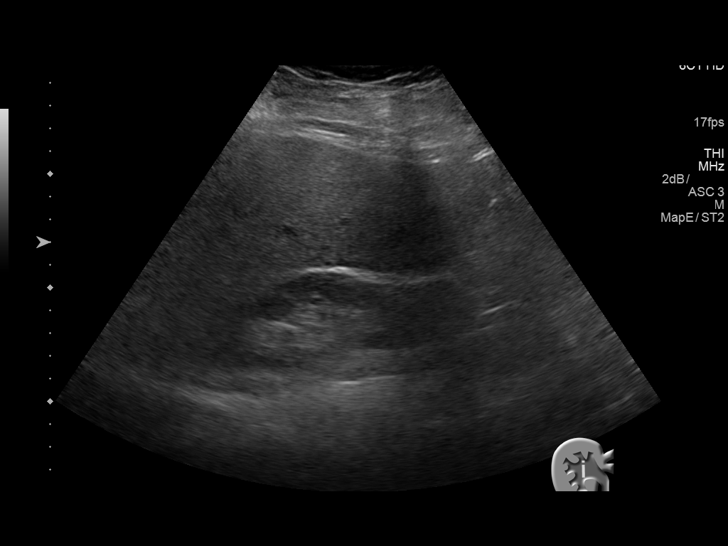

[14 of 25 positions shown; findings below may reference images not displayed]

FINDINGS: Gallbladder:

Status post cholecystectomy.

Common bile duct:

Diameter: 5.3 mm which is within normal limits.

Liver:

No focal lesion identified. Diffusely increased echogenicity is
noted with poor through transmission consistent with fatty
infiltration.
IMPRESSION: Fatty infiltration of the liver. Status post cholecystectomy. No
other abnormality seen in the right upper quadrant of the abdomen.

## 2017-05-21 DIAGNOSIS — I10 Essential (primary) hypertension: Secondary | ICD-10-CM | POA: Diagnosis not present

## 2017-05-21 DIAGNOSIS — E119 Type 2 diabetes mellitus without complications: Secondary | ICD-10-CM | POA: Diagnosis not present

## 2017-05-25 MED FILL — SHIPPING COST: 1 days supply | Qty: 1 | Fill #0

## 2017-05-25 MED FILL — ACCU-CHEK GUIDE TEST STRIP: 90 days supply | Qty: 300 | Fill #0 | Status: TO

## 2017-06-10 DIAGNOSIS — F5104 Psychophysiologic insomnia: Secondary | ICD-10-CM | POA: Diagnosis not present

## 2017-06-10 DIAGNOSIS — I10 Essential (primary) hypertension: Secondary | ICD-10-CM | POA: Diagnosis not present

## 2017-06-10 DIAGNOSIS — M150 Primary generalized (osteo)arthritis: Secondary | ICD-10-CM | POA: Diagnosis not present

## 2017-06-10 DIAGNOSIS — M159 Polyosteoarthritis, unspecified: Secondary | ICD-10-CM | POA: Insufficient documentation

## 2017-06-10 DIAGNOSIS — E119 Type 2 diabetes mellitus without complications: Secondary | ICD-10-CM | POA: Diagnosis not present

## 2017-06-10 DIAGNOSIS — M8949 Other hypertrophic osteoarthropathy, multiple sites: Secondary | ICD-10-CM | POA: Insufficient documentation

## 2017-07-15 NOTE — Patient Outreach (Signed)
[facility]) Care Management  07/15/2017  Patricia [name] November 17, 1955 [id number]   I have removed myself from the care team in [facility].  I will continue to assist her in managing her diabetes through the [facility] app through [facility].   Gentry Fitz, RN, BA, [place], [place] Direct Dial:  [phone]  Fax:  [phone] E-mail: [email or web address] [address], [place], [place]  [address]

## 2017-08-06 DIAGNOSIS — R1011 Right upper quadrant pain: Secondary | ICD-10-CM | POA: Diagnosis not present

## 2017-08-12 DIAGNOSIS — G4733 Obstructive sleep apnea (adult) (pediatric): Secondary | ICD-10-CM | POA: Diagnosis not present

## 2017-09-08 ENCOUNTER — Telehealth: Payer: Self-pay | Admitting: *Deleted

## 2017-09-08 DIAGNOSIS — Z122 Encounter for screening for malignant neoplasm of respiratory organs: Secondary | ICD-10-CM

## 2017-09-08 DIAGNOSIS — Z87891 Personal history of nicotine dependence: Secondary | ICD-10-CM

## 2017-09-08 NOTE — Telephone Encounter (Signed)
Notified patient that annual lung cancer screening low dose CT scan is due currently or will be in near future. Confirmed that patient is within the age range of 55-77, and asymptomatic, (no signs or symptoms of lung cancer). Patient denies illness that would prevent curative treatment for lung cancer if found. Verified smoking history, (current, 31 pack year). The shared decision making visit was done 09/09/16. Patient is agreeable for CT scan being scheduled.

## 2017-09-15 ENCOUNTER — Ambulatory Visit: Admission: RE | Admit: 2017-09-15 | Payer: 59 | Source: Ambulatory Visit

## 2017-09-18 ENCOUNTER — Ambulatory Visit
Admission: RE | Admit: 2017-09-18 | Discharge: 2017-09-18 | Disposition: A | Discharge status: Home / Self Care | Payer: 59 | Source: Ambulatory Visit | Attending: Nurse Practitioner | Admitting: Nurse Practitioner

## 2017-09-18 DIAGNOSIS — Z87891 Personal history of nicotine dependence: Secondary | ICD-10-CM | POA: Insufficient documentation

## 2017-09-18 DIAGNOSIS — I700 Atherosclerosis of aorta: Secondary | ICD-10-CM | POA: Diagnosis not present

## 2017-09-18 DIAGNOSIS — I2510 Atherosclerotic heart disease of native coronary artery without angina pectoris: Secondary | ICD-10-CM | POA: Insufficient documentation

## 2017-09-18 DIAGNOSIS — Z122 Encounter for screening for malignant neoplasm of respiratory organs: Secondary | ICD-10-CM | POA: Insufficient documentation

## 2017-09-18 DIAGNOSIS — K760 Fatty (change of) liver, not elsewhere classified: Secondary | ICD-10-CM | POA: Insufficient documentation

## 2017-09-18 DIAGNOSIS — J432 Centrilobular emphysema: Secondary | ICD-10-CM | POA: Insufficient documentation

## 2017-09-18 DIAGNOSIS — F17210 Nicotine dependence, cigarettes, uncomplicated: Secondary | ICD-10-CM | POA: Diagnosis not present

## 2017-09-18 DIAGNOSIS — R911 Solitary pulmonary nodule: Secondary | ICD-10-CM | POA: Diagnosis not present

## 2017-09-21 ENCOUNTER — Telehealth: Payer: Self-pay | Admitting: *Deleted

## 2017-09-21 NOTE — Telephone Encounter (Signed)
Notified patient of LDCT lung cancer screening program results with recommendation for 12 month follow up imaging. Also notified of incidental findings noted below and is encouraged to discuss further with PCP who will receive a copy of this note and/or the CT report. Patient verbalizes understanding.   IMPRESSION: 1. Lung-RADS 2, benign appearance or behavior. Continue annual screening with low-dose chest CT without contrast in 12 months. 2. Age advanced coronary artery atherosclerosis. Recommend assessment of coronary risk factors and consideration of medical therapy. 3. Aortic atherosclerosis (ICD10-I70.0) and emphysema (ICD10-J43.9). 4. Hepatic steatosis. Prominent caudate and lateral segment left liver lobes, for which mild cirrhosis cannot be excluded. Correlate with risk factors and recommend attention on follow-up.

## 2017-09-23 ENCOUNTER — Ambulatory Visit: Admission: RE | Admit: 2017-09-23 | Payer: 59 | Source: Ambulatory Visit

## 2017-11-10 DIAGNOSIS — G4733 Obstructive sleep apnea (adult) (pediatric): Secondary | ICD-10-CM | POA: Diagnosis not present

## 2017-12-03 DIAGNOSIS — E119 Type 2 diabetes mellitus without complications: Secondary | ICD-10-CM | POA: Diagnosis not present

## 2017-12-03 DIAGNOSIS — I10 Essential (primary) hypertension: Secondary | ICD-10-CM | POA: Diagnosis not present

## 2017-12-10 ENCOUNTER — Other Ambulatory Visit: Payer: Self-pay | Admitting: Internal Medicine

## 2017-12-10 DIAGNOSIS — E119 Type 2 diabetes mellitus without complications: Secondary | ICD-10-CM | POA: Diagnosis not present

## 2017-12-10 DIAGNOSIS — Z0000 Encounter for general adult medical examination without abnormal findings: Secondary | ICD-10-CM | POA: Diagnosis not present

## 2017-12-10 DIAGNOSIS — Z1231 Encounter for screening mammogram for malignant neoplasm of breast: Secondary | ICD-10-CM

## 2017-12-22 ENCOUNTER — Ambulatory Visit
Admission: RE | Admit: 2017-12-22 | Discharge: 2017-12-22 | Disposition: A | Discharge status: Home / Self Care | Payer: 59 | Source: Ambulatory Visit | Attending: Internal Medicine | Admitting: Internal Medicine

## 2017-12-22 DIAGNOSIS — Z1231 Encounter for screening mammogram for malignant neoplasm of breast: Secondary | ICD-10-CM | POA: Diagnosis not present

## 2017-12-25 ENCOUNTER — Emergency Department
Admission: EM | Admit: 2017-12-25 | Discharge: 2017-12-25 | Disposition: A | Discharge status: Home / Self Care | Payer: 59 | Attending: Student in an Organized Health Care Education/Training Program | Admitting: Student in an Organized Health Care Education/Training Program

## 2017-12-25 ENCOUNTER — Other Ambulatory Visit: Payer: Self-pay

## 2017-12-25 DIAGNOSIS — I10 Essential (primary) hypertension: Secondary | ICD-10-CM | POA: Insufficient documentation

## 2017-12-25 DIAGNOSIS — K529 Noninfective gastroenteritis and colitis, unspecified: Secondary | ICD-10-CM

## 2017-12-25 DIAGNOSIS — R197 Diarrhea, unspecified: Secondary | ICD-10-CM

## 2017-12-25 DIAGNOSIS — A080 Rotaviral enteritis: Secondary | ICD-10-CM | POA: Diagnosis not present

## 2017-12-25 DIAGNOSIS — Z9049 Acquired absence of other specified parts of digestive tract: Secondary | ICD-10-CM | POA: Diagnosis not present

## 2017-12-25 DIAGNOSIS — Z7984 Long term (current) use of oral hypoglycemic drugs: Secondary | ICD-10-CM | POA: Diagnosis not present

## 2017-12-25 DIAGNOSIS — F17210 Nicotine dependence, cigarettes, uncomplicated: Secondary | ICD-10-CM | POA: Insufficient documentation

## 2017-12-25 DIAGNOSIS — Z8541 Personal history of malignant neoplasm of cervix uteri: Secondary | ICD-10-CM | POA: Diagnosis not present

## 2017-12-25 DIAGNOSIS — Z79899 Other long term (current) drug therapy: Secondary | ICD-10-CM | POA: Diagnosis not present

## 2017-12-25 DIAGNOSIS — B338 Other specified viral diseases: Secondary | ICD-10-CM | POA: Diagnosis not present

## 2017-12-25 DIAGNOSIS — E119 Type 2 diabetes mellitus without complications: Secondary | ICD-10-CM | POA: Insufficient documentation

## 2017-12-25 DIAGNOSIS — F329 Major depressive disorder, single episode, unspecified: Secondary | ICD-10-CM | POA: Insufficient documentation

## 2017-12-25 DIAGNOSIS — R112 Nausea with vomiting, unspecified: Secondary | ICD-10-CM | POA: Diagnosis not present

## 2017-12-25 LAB — COMPREHENSIVE METABOLIC PANEL WITH GFR
ALT: 38 U/L (ref 14–54)
AST: 32 U/L (ref 15–41)
Albumin: 4 g/dL (ref 3.5–5.0)
Alkaline Phosphatase: 130 U/L — ABNORMAL HIGH (ref 38–126)
Anion gap: 8 (ref 5–15)
BUN: 9 mg/dL (ref 6–20)
CO2: 24 mmol/L (ref 22–32)
Calcium: 9.1 mg/dL (ref 8.9–10.3)
Chloride: 105 mmol/L (ref 101–111)
Creatinine, Ser: 0.59 mg/dL (ref 0.44–1.00)
GFR calc Af Amer: >60 mL/min
GFR calc non Af Amer: >60 mL/min
Glucose, Bld: 141 mg/dL — ABNORMAL HIGH (ref 65–99)
Potassium: 3.8 mmol/L (ref 3.5–5.1)
Sodium: 137 mmol/L (ref 135–145)
Total Bilirubin: 0.8 mg/dL (ref 0.3–1.2)
Total Protein: 7.2 g/dL (ref 6.5–8.1)

## 2017-12-25 LAB — GASTROINTESTINAL PANEL BY PCR, STOOL (REPLACES STOOL CULTURE)
ADENOVIRUS F40/41: NOT DETECTED
ASTROVIRUS: NOT DETECTED
CAMPYLOBACTER SPECIES: NOT DETECTED
CYCLOSPORA CAYETANENSIS: NOT DETECTED
Cryptosporidium: NOT DETECTED
ENTEROTOXIGENIC E COLI (ETEC): NOT DETECTED
Entamoeba histolytica: NOT DETECTED
Enteroaggregative E coli (EAEC): NOT DETECTED
Enteropathogenic E coli (EPEC): NOT DETECTED
Giardia lamblia: NOT DETECTED
Norovirus GI/GII: NOT DETECTED
PLESIMONAS SHIGELLOIDES: NOT DETECTED
ROTAVIRUS A: DETECTED — AB
Salmonella species: NOT DETECTED
Sapovirus (I, II, IV, and V): NOT DETECTED
Shiga like toxin producing E coli (STEC): NOT DETECTED
Shigella/Enteroinvasive E coli (EIEC): NOT DETECTED
VIBRIO SPECIES: NOT DETECTED
Vibrio cholerae: NOT DETECTED
Yersinia enterocolitica: NOT DETECTED

## 2017-12-25 LAB — CBC WITH DIFFERENTIAL/PLATELET
Basophils Absolute: 0 K/uL (ref 0–0.1)
Basophils Relative: 0 %
Eosinophils Absolute: 0.1 K/uL (ref 0–0.7)
Eosinophils Relative: 1 %
HCT: 48.3 % — ABNORMAL HIGH (ref 35.0–47.0)
Hemoglobin: 16.2 g/dL — ABNORMAL HIGH (ref 12.0–16.0)
Lymphocytes Relative: 10 %
Lymphs Abs: 1.1 K/uL (ref 1.0–3.6)
MCH: 31 pg (ref 26.0–34.0)
MCHC: 33.6 g/dL (ref 32.0–36.0)
MCV: 92.2 fL (ref 80.0–100.0)
Monocytes Absolute: 0.9 K/uL (ref 0.2–0.9)
Monocytes Relative: 7 %
Neutro Abs: 9.4 K/uL — ABNORMAL HIGH (ref 1.4–6.5)
Neutrophils Relative %: 82 %
Platelets: 147 K/uL — ABNORMAL LOW (ref 150–440)
RBC: 5.24 MIL/uL — ABNORMAL HIGH (ref 3.80–5.20)
RDW: 13.7 % (ref 11.5–14.5)
WBC: 11.5 K/uL — ABNORMAL HIGH (ref 3.6–11.0)

## 2017-12-25 LAB — URINALYSIS, COMPLETE (UACMP) WITH MICROSCOPIC
Bilirubin Urine: NEGATIVE
Glucose, UA: NEGATIVE mg/dL
Ketones, ur: 20 mg/dL — AB
Leukocytes, UA: NEGATIVE
Nitrite: NEGATIVE
Protein, ur: 30 mg/dL — AB
Specific Gravity, Urine: 1.03 (ref 1.005–1.030)
pH: 5 (ref 5.0–8.0)

## 2017-12-25 LAB — C DIFFICILE QUICK SCREEN W PCR REFLEX
C DIFFICILE (CDIFF) INTERP: NOT DETECTED
C Diff antigen: NEGATIVE
C Diff toxin: NEGATIVE

## 2017-12-25 LAB — LIPASE, BLOOD: Lipase: 31 U/L (ref 11–51)

## 2017-12-25 MED ORDER — METOCLOPRAMIDE HCL 5 MG/ML IJ SOLN: 10.0000 mg | Freq: Once | INTRAMUSCULAR | Status: DC

## 2017-12-25 MED ORDER — ONDANSETRON HCL 4 MG/2ML IJ SOLN: 4.0000 mg | Freq: Once | INTRAMUSCULAR | Status: DC

## 2017-12-25 MED ORDER — ONDANSETRON 4 MG PO TBDP
ORAL_TABLET | ORAL | Status: AC
  Filled 2017-12-25: qty 1

## 2017-12-25 MED ORDER — METOCLOPRAMIDE HCL 5 MG/ML IJ SOLN
10.0000 mg | Freq: Once | INTRAMUSCULAR | Status: AC
  Administered 2017-12-25: 10 mg via INTRAVENOUS
  Filled 2017-12-25: qty 2

## 2017-12-25 MED ORDER — SODIUM CHLORIDE 0.9 % IV BOLUS
1000.0000 mL | Freq: Once | INTRAVENOUS | Status: AC
  Administered 2017-12-25: 1000 mL via INTRAVENOUS

## 2017-12-25 MED ORDER — ONDANSETRON 4 MG PO TBDP
4.0000 mg | ORAL_TABLET | Freq: Once | ORAL | Status: AC
  Administered 2017-12-25: 4 mg via ORAL

## 2017-12-25 MED ORDER — METOCLOPRAMIDE HCL 10 MG PO TABS: 10.0000 mg | ORAL_TABLET | Freq: Four times a day (QID) | ORAL | 0 refills | Status: DC | PRN

## 2017-12-25 NOTE — ED Provider Notes (Signed)
[facility] Emergency Department Provider Note    First MD Initiated Contact with Patient 12/25/17 1733     (approximate)  I have reviewed the triage vital signs and the nursing notes.   HISTORY  Chief Complaint Diarrhea and Emesis    HPI Patricia [name] is a 63 y.o. female presents to the ER with intractable nausea vomiting diarrhea that started last night after the patient ate grilled tilapia a cane W.  Has not noted any blood in her stool.  States that she has had roughly 20 episodes.  Had 2 episodes of nonbloody nonbilious vomiting this afternoon so she came to the ER.  Does have a history of IBS.  She is status post appendectomy as well as chole cystectomy.  She denies any chest pain or shortness of breath  Past Medical History:  Diagnosis Date  . Allergy   . Cancer (HCC)    cervical  . Depression   . Diabetes mellitus without complication ([place])   . Gastritis 2016  . GERD (gastroesophageal reflux disease)   . Hepatic disease   . Hyperlipidemia   . Hypertension   . Sleep apnea   . Spinal stenosis    Family History  Problem Relation Age of Onset  . Diabetes Father   . Diabetes Sister   . Diabetes Paternal Grandmother   . Heart disease Mother   . Hypertension Mother   . Hyperlipidemia Mother   . Bipolar disorder Daughter    Past Surgical History:  Procedure Laterality Date  . APPENDECTOMY    . BREAST BIOPSY Right 2006   bx/clip-neg  . CARPAL TUNNEL RELEASE Right   . CERVICAL DISCECTOMY    . CHOLECYSTECTOMY    . KNEE ARTHROSCOPY Bilateral   . ROTATOR CUFF REPAIR Right   . TONSILLECTOMY    . uterine ablation     Patient Active Problem List   Diagnosis Date Noted  . Personal history of tobacco use, presenting hazards to health 09/12/2016      Prior to Admission medications   Medication Sig Start Date End Date Taking? Authorizing Provider  albuterol (PROVENTIL HFA;VENTOLIN HFA) 108 (90 Base) MCG/ACT inhaler Inhale 2 puffs  into the lungs every 6 (six) hours as needed for wheezing or shortness of breath. 11/07/15   Schaevitz, Randall An, MD  Dexlansoprazole (DEXILANT) 30 MG capsule TAKE 1 CAPSULE (30 MG TOTAL) BY MOUTH ONCE DAILY. 01/17/15   [provider]  furosemide (LASIX) 20 MG tablet Take 20 mg by mouth daily as needed for edema.    [provider]  gabapentin (NEURONTIN) 300 MG capsule Take 600 mg by mouth at bedtime. Takes 371m only    [provider]  Glucosamine-Chondroitin 250-200 MG TABS Take 1 tablet by mouth 2 (two) times daily.     [provider]  metFORMIN (GLUCOPHAGE) 500 MG tablet Take 500 mg by mouth daily at 12 noon.  12/26/15 12/25/16  [provider]  methocarbamol (ROBAXIN) 750 MG tablet Take 1 tablet by mouth at bedtime. 11/26/15   [provider]  metoCLOPramide (REGLAN) 10 MG tablet Take 1 tablet (10 mg total) by mouth every 6 (six) hours as needed for nausea. 12/25/17 12/25/18  RMerlyn Lot MD  ondansetron (ZOFRAN) 4 MG tablet Take 1 tablet (4 mg total) by mouth daily as needed. 11/07/15   SOrbie Pyo MD  propranolol ER (INDERAL LA) 60 MG 24 hr capsule TAKE ONE CAPSULE BY MOUTH ONCE DAILY 06/05/14   [provider]  sucralfate (CARAFATE) 1 G tablet Take 1 g by mouth 4 (four) times daily -  with meals and at bedtime. Only taking 1Gram qam    [provider]  traMADol (ULTRAM) 50 MG tablet Take 50 mg by mouth every 6 (six) hours as needed. Only using about 1/2 tab once a month    [provider]  zolpidem (AMBIEN) 5 MG tablet Take 5 mg by mouth at bedtime.    [provider]    Allergies Codeine; Amlodipine; Deconsal ii [phenylephrine-guaifenesin]; Pantoprazole sodium; Paxil  [paroxetine hcl]; Promethazine; Pseudoephedrine; Omeprazole; Other; and Povidone iodine    Social History Social History   Tobacco Use  . Smoking status: Current Every Day Smoker    Packs/day: 1.00    Years:  30.00    Pack years: 30.00    Types: Cigarettes  . Smokeless tobacco: Never Used  . Tobacco comment: has quit in the past multiple times  Substance Use Topics  . Alcohol use: No    Alcohol/week: 0.0 oz  . Drug use: No    Review of Systems Patient denies headaches, rhinorrhea, blurry vision, numbness, shortness of breath, chest pain, edema, cough, abdominal pain, nausea, vomiting, diarrhea, dysuria, fevers, rashes or hallucinations unless otherwise stated above in HPI. ____________________________________________   PHYSICAL EXAM:  VITAL SIGNS: Vitals:   12/25/17 1814 12/25/17 1830  BP: 134/62 (!) 144/72  Pulse: (!) 104 (!) 106  Resp:    Temp:    SpO2: 97% 94%    Constitutional: Alert and oriented.  in no acute distress. Eyes: Conjunctivae are normal.  Head: Atraumatic. Nose: No congestion/rhinnorhea. Mouth/Throat: Mucous membranes are moist.   Neck: No stridor. Painless ROM.  Cardiovascular: Normal rate, regular rhythm. Grossly normal heart sounds.  Good peripheral circulation. Respiratory: Normal respiratory effort.  No retractions. Lungs CTAB. Gastrointestinal: Soft and nontender. No distention. No abdominal bruits. No CVA tenderness. Genitourinary: deferred Musculoskeletal: No lower extremity tenderness nor edema.  No joint effusions. Neurologic:  Normal speech and language. No gross focal neurologic deficits are appreciated. No facial droop Skin:  Skin is warm, dry and intact. No rash noted. Psychiatric: Mood and affect are normal. Speech and behavior are normal.  ____________________________________________   LABS (all labs ordered are listed, but only abnormal results are displayed)  Results for orders placed or performed during the hospital encounter of 12/25/17 (from the past 24 hour(s))  Lipase, blood     Status: None   Collection Time: 12/25/17  3:30 PM  Result Value Ref Range   Lipase 31 11 - 51 U/L  Comprehensive metabolic panel     Status: Abnormal    Collection Time: 12/25/17  3:30 PM  Result Value Ref Range   Sodium 137 135 - 145 mmol/L   Potassium 3.8 3.5 - 5.1 mmol/L   Chloride 105 101 - 111 mmol/L   CO2 24 22 - 32 mmol/L   Glucose, Bld 141 (H) 65 - 99 mg/dL   BUN 9 6 - 20 mg/dL   Creatinine, Ser 0.59 0.44 - 1.00 mg/dL   Calcium 9.1 8.9 - 10.3 mg/dL   Total Protein 7.2 6.5 - 8.1 g/dL   Albumin 4.0 3.5 - 5.0 g/dL   AST 32 15 - 41 U/L   ALT 38 14 - 54 U/L   Alkaline Phosphatase 130 (H) 38 - 126 U/L   Total Bilirubin 0.8 0.3 - 1.2 mg/dL   GFR calc non Af Amer >60 >60 mL/min   GFR calc Af  Amer >60 >60 mL/min   Anion gap 8 5 - 15  CBC with Differential     Status: Abnormal   Collection Time: 12/25/17  3:30 PM  Result Value Ref Range   WBC 11.5 (H) 3.6 - 11.0 K/uL   RBC 5.24 (H) 3.80 - 5.20 MIL/uL   Hemoglobin 16.2 (H) 12.0 - 16.0 g/dL   HCT 48.3 (H) 35.0 - 47.0 %   MCV 92.2 80.0 - 100.0 fL   MCH 31.0 26.0 - 34.0 pg   MCHC 33.6 32.0 - 36.0 g/dL   RDW 13.7 11.5 - 14.5 %   Platelets 147 (L) 150 - 440 K/uL   Neutrophils Relative % 82 %   Neutro Abs 9.4 (H) 1.4 - 6.5 K/uL   Lymphocytes Relative 10 %   Lymphs Abs 1.1 1.0 - 3.6 K/uL   Monocytes Relative 7 %   Monocytes Absolute 0.9 0.2 - 0.9 K/uL   Eosinophils Relative 1 %   Eosinophils Absolute 0.1 0 - 0.7 K/uL   Basophils Relative 0 %   Basophils Absolute 0.0 0 - 0.1 K/uL  Urinalysis, Complete w Microscopic     Status: Abnormal   Collection Time: 12/25/17  6:11 PM  Result Value Ref Range   Color, Urine AMBER (A) YELLOW   APPearance TURBID (A) CLEAR   Specific Gravity, Urine 1.030 1.005 - 1.030   pH 5.0 5.0 - 8.0   Glucose, UA NEGATIVE NEGATIVE mg/dL   Hgb urine dipstick MODERATE (A) NEGATIVE   Bilirubin Urine NEGATIVE NEGATIVE   Ketones, ur 20 (A) NEGATIVE mg/dL   Protein, ur 30 (A) NEGATIVE mg/dL   Nitrite NEGATIVE NEGATIVE   Leukocytes, UA NEGATIVE NEGATIVE   RBC / HPF 0-5 0 - 5 RBC/hpf   WBC, UA 0-5 0 - 5 WBC/hpf   Bacteria, UA RARE (A) NONE SEEN   Squamous  Epithelial / LPF 6-10 0 - 5   Mucus PRESENT   Gastrointestinal Panel by PCR , Stool     Status: Abnormal   Collection Time: 12/25/17  6:11 PM  Result Value Ref Range   Campylobacter species NOT DETECTED NOT DETECTED   Plesimonas shigelloides NOT DETECTED NOT DETECTED   Salmonella species NOT DETECTED NOT DETECTED   Yersinia enterocolitica NOT DETECTED NOT DETECTED   Vibrio species NOT DETECTED NOT DETECTED   Vibrio cholerae NOT DETECTED NOT DETECTED   Enteroaggregative E coli (EAEC) NOT DETECTED NOT DETECTED   Enteropathogenic E coli (EPEC) NOT DETECTED NOT DETECTED   Enterotoxigenic E coli (ETEC) NOT DETECTED NOT DETECTED   Shiga like toxin producing E coli (STEC) NOT DETECTED NOT DETECTED   Shigella/Enteroinvasive E coli (EIEC) NOT DETECTED NOT DETECTED   Cryptosporidium NOT DETECTED NOT DETECTED   Cyclospora cayetanensis NOT DETECTED NOT DETECTED   Entamoeba histolytica NOT DETECTED NOT DETECTED   Giardia lamblia NOT DETECTED NOT DETECTED   Adenovirus F40/41 NOT DETECTED NOT DETECTED   Astrovirus NOT DETECTED NOT DETECTED   Norovirus GI/GII NOT DETECTED NOT DETECTED   Rotavirus A DETECTED (A) NOT DETECTED   Sapovirus (I, II, IV, and V) NOT DETECTED NOT DETECTED  C difficile quick scan w PCR reflex     Status: None   Collection Time: 12/25/17  6:11 PM  Result Value Ref Range   C Diff antigen NEGATIVE NEGATIVE   C Diff toxin NEGATIVE NEGATIVE   C Diff interpretation No C. difficile detected.    ____________________________________________ ____________________________________________  RADIOLOGY   ____________________________________________   PROCEDURES  Procedure(s) performed:  Procedures    Critical Care performed: no ____________________________________________   INITIAL IMPRESSION / ASSESSMENT AND PLAN / ED COURSE  Pertinent labs & imaging results that were available during my care of the patient were reviewed by me and considered in my medical decision  making (see chart for details).  DDX: enteritis, gastritis, uti, c dif, colitis  Patricia [name] is a 62 y.o. who presents to the ED with symptoms as described above.  Blood work fairly reassuring.  No profound hyponatremia and AKI.  Will provide IV fluids as well as IV antiemetics.  Anticipate foodborne illness probable gastroenteritis.  Do not feel that emergent CT imaging clinically indicated.  Clinical Course as of Dec 26 2014  Fri Dec 25, 2017  1953 C. difficile negative.  Patient requesting something to drink.   [PR]  2011 Patient states that she is having significant improvement.  Tachycardia resolved.  Tolerating oral hydration.  Requesting additional dose of IV antiemetic which I think is reasonable.  She is otherwise no acute distress with improved symptomatology I do believe patient stable and appropriate for outpatient follow-up.  We discussed strict return parameters.  Have discussed with the patient and available family all diagnostics and treatments performed thus far and all questions were answered to the best of my ability. The patient demonstrates understanding and agreement with plan.    [PR]    Clinical Course User Index [PR] Merlyn Lot, MD     As part of my medical decision making, I reviewed the following data within the [facility] notes reviewed and incorporated, Labs reviewed, notes from prior ED visits and [place] Controlled Substance Database   ____________________________________________   FINAL CLINICAL IMPRESSION(S) / ED DIAGNOSES  Final diagnoses:  Nausea vomiting and diarrhea  Gastroenteritis  Rotavirus infection      NEW MEDICATIONS STARTED DURING THIS VISIT:  New Prescriptions   METOCLOPRAMIDE (REGLAN) 10 MG TABLET    Take 1 tablet (10 mg total) by mouth every 6 (six) hours as needed for nausea.     Note:  This document was prepared using Dragon voice recognition software and may include unintentional dictation  errors.    Merlyn Lot, MD 12/25/17 2016

## 2017-12-25 NOTE — Discharge Instructions (Addendum)

## 2017-12-25 NOTE — ED Triage Notes (Signed)
Pt reports that she started feeling sick after eating fish yesterday - pt started with diarrhea last pm and vomiting today - (loose stools x20 in 24 hours - vomited x2 in 24 hours)

## 2017-12-25 NOTE — ED Notes (Signed)
Pt discharged to home.  Family member driving.  Discharge instructions reviewed.  Verbalized understanding.  No questions or concerns at this time.  Teach back verified.  Pt in NAD.  No items left in ED.   

## 2017-12-28 DIAGNOSIS — I10 Essential (primary) hypertension: Secondary | ICD-10-CM | POA: Diagnosis not present

## 2017-12-28 DIAGNOSIS — K529 Noninfective gastroenteritis and colitis, unspecified: Secondary | ICD-10-CM | POA: Diagnosis not present

## 2017-12-28 DIAGNOSIS — A080 Rotaviral enteritis: Secondary | ICD-10-CM | POA: Diagnosis not present

## 2017-12-28 DIAGNOSIS — E119 Type 2 diabetes mellitus without complications: Secondary | ICD-10-CM | POA: Diagnosis not present

## 2018-01-07 DIAGNOSIS — L308 Other specified dermatitis: Secondary | ICD-10-CM | POA: Diagnosis not present

## 2018-01-07 DIAGNOSIS — L578 Other skin changes due to chronic exposure to nonionizing radiation: Secondary | ICD-10-CM | POA: Diagnosis not present

## 2018-01-07 DIAGNOSIS — L2089 Other atopic dermatitis: Secondary | ICD-10-CM | POA: Diagnosis not present

## 2018-02-09 DIAGNOSIS — L578 Other skin changes due to chronic exposure to nonionizing radiation: Secondary | ICD-10-CM | POA: Diagnosis not present

## 2018-02-09 DIAGNOSIS — L570 Actinic keratosis: Secondary | ICD-10-CM | POA: Diagnosis not present

## 2018-02-10 DIAGNOSIS — G4733 Obstructive sleep apnea (adult) (pediatric): Secondary | ICD-10-CM | POA: Diagnosis not present

## 2018-02-23 DIAGNOSIS — Z86010 Personal history of colonic polyps: Secondary | ICD-10-CM | POA: Diagnosis not present

## 2018-03-11 DIAGNOSIS — E119 Type 2 diabetes mellitus without complications: Secondary | ICD-10-CM | POA: Diagnosis not present

## 2018-03-24 DIAGNOSIS — H5213 Myopia, bilateral: Secondary | ICD-10-CM | POA: Diagnosis not present

## 2018-03-24 DIAGNOSIS — H43313 Vitreous membranes and strands, bilateral: Secondary | ICD-10-CM | POA: Diagnosis not present

## 2018-03-24 DIAGNOSIS — E119 Type 2 diabetes mellitus without complications: Secondary | ICD-10-CM | POA: Diagnosis not present

## 2018-03-26 DIAGNOSIS — K7581 Nonalcoholic steatohepatitis (NASH): Secondary | ICD-10-CM | POA: Diagnosis not present

## 2018-05-07 ENCOUNTER — Ambulatory Visit
Admission: RE | Admit: 2018-05-07 | Discharge: 2018-05-07 | Disposition: A | Discharge status: Home / Self Care | Payer: 59 | Source: Ambulatory Visit | Attending: Unknown Physician Specialty | Admitting: Unknown Physician Specialty

## 2018-05-07 ENCOUNTER — Ambulatory Visit: Payer: 59 | Admitting: Certified Registered"

## 2018-05-07 ENCOUNTER — Encounter
Admission: RE | Disposition: A | Discharge status: Home / Self Care | Payer: Self-pay | Source: Ambulatory Visit | Attending: Unknown Physician Specialty

## 2018-05-07 DIAGNOSIS — K7581 Nonalcoholic steatohepatitis (NASH): Secondary | ICD-10-CM | POA: Insufficient documentation

## 2018-05-07 DIAGNOSIS — F17210 Nicotine dependence, cigarettes, uncomplicated: Secondary | ICD-10-CM | POA: Insufficient documentation

## 2018-05-07 DIAGNOSIS — K635 Polyp of colon: Secondary | ICD-10-CM | POA: Diagnosis not present

## 2018-05-07 DIAGNOSIS — Z79899 Other long term (current) drug therapy: Secondary | ICD-10-CM | POA: Insufficient documentation

## 2018-05-07 DIAGNOSIS — E785 Hyperlipidemia, unspecified: Secondary | ICD-10-CM | POA: Insufficient documentation

## 2018-05-07 DIAGNOSIS — D120 Benign neoplasm of cecum: Secondary | ICD-10-CM | POA: Diagnosis not present

## 2018-05-07 DIAGNOSIS — Z8541 Personal history of malignant neoplasm of cervix uteri: Secondary | ICD-10-CM | POA: Insufficient documentation

## 2018-05-07 DIAGNOSIS — Z09 Encounter for follow-up examination after completed treatment for conditions other than malignant neoplasm: Secondary | ICD-10-CM | POA: Insufficient documentation

## 2018-05-07 DIAGNOSIS — Z86010 Personal history of colonic polyps: Secondary | ICD-10-CM | POA: Diagnosis not present

## 2018-05-07 DIAGNOSIS — K648 Other hemorrhoids: Secondary | ICD-10-CM | POA: Insufficient documentation

## 2018-05-07 DIAGNOSIS — D123 Benign neoplasm of transverse colon: Secondary | ICD-10-CM | POA: Diagnosis not present

## 2018-05-07 DIAGNOSIS — I10 Essential (primary) hypertension: Secondary | ICD-10-CM | POA: Insufficient documentation

## 2018-05-07 DIAGNOSIS — Z1211 Encounter for screening for malignant neoplasm of colon: Secondary | ICD-10-CM | POA: Diagnosis not present

## 2018-05-07 DIAGNOSIS — D125 Benign neoplasm of sigmoid colon: Secondary | ICD-10-CM | POA: Diagnosis not present

## 2018-05-07 DIAGNOSIS — K640 First degree hemorrhoids: Secondary | ICD-10-CM | POA: Diagnosis not present

## 2018-05-07 DIAGNOSIS — D126 Benign neoplasm of colon, unspecified: Secondary | ICD-10-CM | POA: Diagnosis not present

## 2018-05-07 DIAGNOSIS — E119 Type 2 diabetes mellitus without complications: Secondary | ICD-10-CM | POA: Diagnosis not present

## 2018-05-07 DIAGNOSIS — G4730 Sleep apnea, unspecified: Secondary | ICD-10-CM | POA: Diagnosis not present

## 2018-05-07 DIAGNOSIS — D122 Benign neoplasm of ascending colon: Secondary | ICD-10-CM | POA: Insufficient documentation

## 2018-05-07 HISTORY — DX: Nonalcoholic steatohepatitis (NASH): K75.81

## 2018-05-07 HISTORY — PX: COLONOSCOPY WITH PROPOFOL: SHX5780

## 2018-05-07 LAB — GLUCOSE, CAPILLARY: GLUCOSE-CAPILLARY: 121 mg/dL — AB (ref 70–99)

## 2018-05-07 SURGERY — COLONOSCOPY WITH PROPOFOL
Anesthesia: General

## 2018-05-07 MED ORDER — PROPOFOL 10 MG/ML IV BOLUS
INTRAVENOUS | Status: AC
  Filled 2018-05-07: qty 40

## 2018-05-07 MED ORDER — SODIUM CHLORIDE 0.9 % IV SOLN: INTRAVENOUS | Status: DC

## 2018-05-07 MED ORDER — LIDOCAINE HCL (CARDIAC) PF 100 MG/5ML IV SOSY
PREFILLED_SYRINGE | INTRAVENOUS | Status: DC | PRN
  Administered 2018-05-07: 100 mg via INTRAVENOUS

## 2018-05-07 MED ORDER — SODIUM CHLORIDE 0.9 % IV SOLN
INTRAVENOUS | Status: DC
  Administered 2018-05-07: 1000 mL via INTRAVENOUS

## 2018-05-07 MED ORDER — PROPOFOL 10 MG/ML IV BOLUS
INTRAVENOUS | Status: DC | PRN
  Administered 2018-05-07: 70 mg via INTRAVENOUS

## 2018-05-07 MED ORDER — PROPOFOL 500 MG/50ML IV EMUL
INTRAVENOUS | Status: DC | PRN
  Administered 2018-05-07: 100 ug/kg/min via INTRAVENOUS

## 2018-05-07 NOTE — Anesthesia Post-op Follow-up Note (Signed)
Anesthesia QCDR form completed.        

## 2018-05-07 NOTE — Op Note (Signed)
[facility] Gastroenterology Patient Name: Jameshia Hayashida Procedure Date: 05/07/2018 8:27 AM MRN: [id number] Account #: [id number] Date of Birth: 1955-11-27 Admit Type: Outpatient Age: 62 Room: [facility] ENDO ROOM 1 Gender: Female Note Status: Finalized Procedure:            Colonoscopy Indications:          High risk colon cancer surveillance: Personal history                        of colonic polyps Providers:            Manya Silvas, MD Referring MD:         Glendon Axe (Referring MD) Medicines:            Propofol per Anesthesia Complications:        No immediate complications. Procedure:            Pre-Anesthesia Assessment:                       - After reviewing the risks and benefits, the patient                        was deemed in satisfactory condition to undergo the                        procedure.                       After obtaining informed consent, the colonoscope was                        passed under direct vision. Throughout the procedure,                        the patient's blood pressure, pulse, and oxygen                        saturations were monitored continuously. The                        Colonoscope was introduced through the anus and                        advanced to the the cecum, identified by appendiceal                        orifice and ileocecal valve. The colonoscopy was                        performed without difficulty. The patient tolerated the                        procedure well. The quality of the bowel preparation                        was good. Findings:      A diminutive polyp was found in the hepatic flexure. The polyp was       sessile. The polyp was removed with a hot snare. Resection and retrieval       were complete.      A diminutive polyp was found in the  cecum. The polyp was sessile. The       polyp was removed with a hot snare. Resection and retrieval were       complete.      A diminutive  polyp was found in the ascending colon. The polyp was       sessile. The polyp was removed with a hot snare. Resection and retrieval       were complete.      Two sessile polyps were found in the sigmoid colon. The polyps were       diminutive in size. These polyps were removed with a jumbo cold forceps.       Resection and retrieval were complete.      Internal hemorrhoids were found during endoscopy. The hemorrhoids were       small and Grade I (internal hemorrhoids that do not prolapse). Impression:           - One diminutive polyp at the hepatic flexure, removed                        with a hot snare. Resected and retrieved.                       - One diminutive polyp in the cecum, removed with a hot                        snare. Resected and retrieved.                       - One diminutive polyp in the ascending colon, removed                        with a hot snare. Resected and retrieved.                       - Two diminutive polyps in the sigmoid colon, removed                        with a jumbo cold forceps. Resected and retrieved.                       - Internal hemorrhoids. Recommendation:       - Await pathology results. Manya Silvas, MD 05/07/2018 9:17:19 AM This report has been signed electronically. Number of Addenda: 0 Note Initiated On: 05/07/2018 8:27 AM Scope Withdrawal Time: 0 hours 13 minutes 37 seconds  Total Procedure Duration: 0 hours 19 minutes 33 seconds       [facility]

## 2018-05-07 NOTE — Anesthesia Postprocedure Evaluation (Signed)
Anesthesia Post Note  Patient: NAJAI WASZAK  Procedure(s) Performed: COLONOSCOPY WITH PROPOFOL (N/A )  Patient location during evaluation: PACU Anesthesia Type: General Level of consciousness: awake and alert Pain management: pain level controlled Vital Signs Assessment: post-procedure vital signs reviewed and stable Respiratory status: spontaneous breathing, nonlabored ventilation, respiratory function stable and patient connected to nasal cannula oxygen Cardiovascular status: blood pressure returned to baseline and stable Postop Assessment: no apparent nausea or vomiting Anesthetic complications: no     Last Vitals:  Vitals:   05/07/18 0930 05/07/18 0940  BP: 116/73 (!) 120/57  Pulse: 70 62  Resp: 19 19  Temp:    SpO2: 100% 100%    Last Pain:  Vitals:   05/07/18 0910  TempSrc: Tympanic                 Molli Barrows

## 2018-05-07 NOTE — Transfer of Care (Signed)
Immediate Anesthesia Transfer of Care Note  Patient: Patricia [name]  Procedure(s) Performed: COLONOSCOPY WITH PROPOFOL (N/A )  Patient Location: PACU  Anesthesia Type:General  Level of Consciousness: awake, alert  and oriented  Airway & Oxygen Therapy: Patient Spontanous Breathing and Patient connected to nasal cannula oxygen  Post-op Assessment: Report given to RN and Post -op Vital signs reviewed and stable  Post vital signs: Reviewed and stable  Last Vitals:  Vitals Value Taken Time  BP 103/69 05/07/2018  9:17 AM  Temp    Pulse 79 05/07/2018  9:18 AM  Resp 18 05/07/2018  9:18 AM  SpO2 100 % 05/07/2018  9:18 AM  Vitals shown include unvalidated device data.  Last Pain:  Vitals:   05/07/18 0830  TempSrc: Tympanic         Complications: No apparent anesthesia complications

## 2018-05-07 NOTE — Anesthesia Preprocedure Evaluation (Signed)
Anesthesia Evaluation  Patient identified by MRN, date of birth, ID band Patient awake    Reviewed: Allergy & Precautions, H&P , NPO status , Patient's Chart, lab work & pertinent test results, reviewed documented beta blocker date and time   Airway Mallampati: III   Neck ROM: full    Dental  (+) Poor Dentition, Teeth Intact   Pulmonary neg pulmonary ROS, sleep apnea , Current Smoker,    Pulmonary exam normal        Cardiovascular Exercise Tolerance: Poor hypertension, On Medications negative cardio ROS Normal cardiovascular exam Rhythm:regular Rate:Normal     Neuro/Psych PSYCHIATRIC DISORDERS Depression negative neurological ROS  negative psych ROS   GI/Hepatic negative GI ROS, Neg liver ROS, GERD  ,  Endo/Other  negative endocrine ROSdiabetesMorbid obesity  Renal/GU negative Renal ROS  negative genitourinary   Musculoskeletal   Abdominal   Peds  Hematology negative hematology ROS (+)   Anesthesia Other Findings Past Medical History: No date: Allergy No date: Cancer [facility])     Comment:  cervical No date: Depression No date: Diabetes mellitus without complication ([place]) 6606: Gastritis No date: GERD (gastroesophageal reflux disease) No date: Hepatic disease No date: Hyperlipidemia No date: Hypertension No date: Sleep apnea No date: Spinal stenosis Past Surgical History: No date: APPENDECTOMY 2006: BREAST BIOPSY; Right     Comment:  bx/clip-neg No date: CARPAL TUNNEL RELEASE; Right No date: CERVICAL DISCECTOMY No date: CHOLECYSTECTOMY No date: KNEE ARTHROSCOPY; Bilateral No date: ROTATOR CUFF REPAIR; Right No date: TONSILLECTOMY No date: uterine ablation BMI    Body Mass Index:  44.81 kg/m     Reproductive/Obstetrics negative OB ROS                             Anesthesia Physical Anesthesia Plan  ASA: III  Anesthesia Plan: General   Post-op Pain Management:     Induction:   PONV Risk Score and Plan:   Airway Management Planned:   Additional Equipment:   Intra-op Plan:   Post-operative Plan:   Informed Consent: I have reviewed the patients History and Physical, chart, labs and discussed the procedure including the risks, benefits and alternatives for the proposed anesthesia with the patient or authorized representative who has indicated his/her understanding and acceptance.   Dental Advisory Given  Plan Discussed with: CRNA  Anesthesia Plan Comments:         Anesthesia Quick Evaluation

## 2018-05-07 NOTE — H&P (Signed)
Primary Care Physician:  Glendon Axe, MD Primary Gastroenterologist:  Dr. Vira Agar  Pre-Procedure History & Physical: HPI:  Patricia [name] is a 62 y.o. female is here for an colonoscopy done for [facility] colon polyps.   Past Medical History:  Diagnosis Date  . Allergy   . Cancer (HCC)    cervical  . Depression   . Diabetes mellitus without complication ([place])   . Gastritis 2016  . GERD (gastroesophageal reflux disease)   . Hepatic disease   . Hyperlipidemia   . Hypertension   . NASH (nonalcoholic steatohepatitis)   . Sleep apnea   . Spinal stenosis     Past Surgical History:  Procedure Laterality Date  . APPENDECTOMY    . BREAST BIOPSY Right 2006   bx/clip-neg  . CARPAL TUNNEL RELEASE Right   . CERVICAL DISCECTOMY    . CHOLECYSTECTOMY    . KNEE ARTHROSCOPY Bilateral   . ROTATOR CUFF REPAIR Right   . TONSILLECTOMY    . uterine ablation      Prior to Admission medications   Medication Sig Start Date End Date Taking? Authorizing Provider  Dulaglutide (TRULICITY) 0.10 OF/1.2RF SOPN Inject into the skin.   Yes [provider]  propranolol ER (INDERAL LA) 60 MG 24 hr capsule TAKE ONE CAPSULE BY MOUTH ONCE DAILY 06/05/14  Yes [provider]  albuterol (PROVENTIL HFA;VENTOLIN HFA) 108 (90 Base) MCG/ACT inhaler Inhale 2 puffs into the lungs every 6 (six) hours as needed for wheezing or shortness of breath. 11/07/15   Schaevitz, Randall An, MD  Dexlansoprazole (DEXILANT) 30 MG capsule TAKE 1 CAPSULE (30 MG TOTAL) BY MOUTH ONCE DAILY. 01/17/15   [provider]  furosemide (LASIX) 20 MG tablet Take 20 mg by mouth daily as needed for edema.    [provider]  gabapentin (NEURONTIN) 300 MG capsule Take 600 mg by mouth at bedtime. Takes 350m only    [provider]  Glucosamine-Chondroitin 250-200 MG TABS Take 1 tablet by mouth 2 (two) times daily.     [provider]  methocarbamol (ROBAXIN) 750 MG tablet Take 1 tablet by  mouth at bedtime. 11/26/15   [provider]  ondansetron (ZOFRAN) 4 MG tablet Take 1 tablet (4 mg total) by mouth daily as needed. 11/07/15   Schaevitz, DRandall An MD  zolpidem (AMBIEN) 5 MG tablet Take 5 mg by mouth at bedtime.    [provider]    Allergies as of 03/22/2018 - Review Complete 12/25/2017  Allergen Reaction Noted  . Codeine Hives 02/07/2015  . Amlodipine Other (See Comments) 11/07/2015  . Deconsal ii [phenylephrine-guaifenesin]  02/07/2015  . Pantoprazole sodium Nausea And Vomiting 02/07/2015  . Paxil  [paroxetine hcl] Nausea And Vomiting 02/07/2015  . Promethazine Other (See Comments) 02/07/2015  . Pseudoephedrine Other (See Comments) 02/07/2015  . Omeprazole Rash 02/07/2015  . Other Rash and Other (See Comments) 02/07/2015  . Povidone iodine Rash 02/07/2015    Family History  Problem Relation Age of Onset  . Diabetes Father   . Diabetes Sister   . Diabetes Paternal Grandmother   . Heart disease Mother   . Hypertension Mother   . Hyperlipidemia Mother   . Bipolar disorder Daughter     Social History   Socioeconomic History  . Marital status: Widowed    Spouse name: Not on file  . Number of children: Not on file  . Years of education: Not on file  . Highest education level: Not on file  Occupational History  . Not on file  Social Needs  . Financial resource strain: Not on file  . Food insecurity:    Worry: Not on file    Inability: Not on file  . Transportation needs:    Medical: Not on file    Non-medical: Not on file  Tobacco Use  . Smoking status: Current Every Day Smoker    Packs/day: 1.00    Years: 30.00    Pack years: 30.00    Types: Cigarettes  . Smokeless tobacco: Never Used  . Tobacco comment: has quit in the past multiple times  Substance and Sexual Activity  . Alcohol use: No    Alcohol/week: 0.0 standard drinks  . Drug use: No  . Sexual activity: Not on file  Lifestyle  . Physical activity:    Days per  week: Not on file    Minutes per session: Not on file  . Stress: Not on file  Relationships  . Social connections:    Talks on phone: Not on file    Gets together: Not on file    Attends religious service: Not on file    Active member of club or organization: Not on file    Attends meetings of clubs or organizations: Not on file    Relationship status: Not on file  . Intimate partner violence:    Fear of current or ex partner: Not on file    Emotionally abused: Not on file    Physically abused: Not on file    Forced sexual activity: Not on file  Other Topics Concern  . Not on file  Social History Narrative  . Not on file    Review of Systems: See HPI, otherwise negative ROS  Physical Exam: BP (!) 154/73   Pulse 73   Temp (!) 97.4 F (36.3 C) (Tympanic)   Resp 20   Ht 5' 2"  (1.575 m)   Wt 111.1 kg   SpO2 99%   BMI 44.81 kg/m  General:   Alert,  pleasant and cooperative in NAD Head:  Normocephalic and atraumatic. Neck:  Supple; no masses or thyromegaly. Lungs:  Clear throughout to auscultation.    Heart:  Regular rate and rhythm. Abdomen:  Soft, nontender and nondistended. Normal bowel sounds, without guarding, and without rebound.   Neurologic:  Alert and  oriented x4;  grossly normal neurologically.  Impression/Plan: Patricia [name] is here for an colonoscopy to be performed for [facility] colon polyps.  Risks, benefits, limitations, and alternatives regarding  colonoscopy have been reviewed with the patient.  Questions have been answered.  All parties agreeable.   Gaylyn Cheers, MD  05/07/2018, 8:33 AM

## 2018-05-09 ENCOUNTER — Encounter: Payer: Self-pay | Admitting: Unknown Physician Specialty

## 2018-05-11 LAB — SURGICAL PATHOLOGY

## 2018-06-11 DIAGNOSIS — I10 Essential (primary) hypertension: Secondary | ICD-10-CM | POA: Diagnosis not present

## 2018-06-18 DIAGNOSIS — K760 Fatty (change of) liver, not elsewhere classified: Secondary | ICD-10-CM | POA: Diagnosis not present

## 2018-06-18 DIAGNOSIS — I10 Essential (primary) hypertension: Secondary | ICD-10-CM | POA: Diagnosis not present

## 2018-06-18 DIAGNOSIS — E119 Type 2 diabetes mellitus without complications: Secondary | ICD-10-CM | POA: Diagnosis not present

## 2018-09-13 ENCOUNTER — Telehealth: Payer: Self-pay | Admitting: *Deleted

## 2018-09-13 DIAGNOSIS — E119 Type 2 diabetes mellitus without complications: Secondary | ICD-10-CM | POA: Diagnosis not present

## 2018-09-13 DIAGNOSIS — Z87891 Personal history of nicotine dependence: Secondary | ICD-10-CM

## 2018-09-13 DIAGNOSIS — Z122 Encounter for screening for malignant neoplasm of respiratory organs: Secondary | ICD-10-CM

## 2018-09-13 NOTE — Telephone Encounter (Signed)
Patient has been notified that annual lung cancer screening low dose CT scan is due currently or will be in near future. Confirmed that patient is within the age range of 55-77, and asymptomatic, (no signs or symptoms of lung cancer). Patient denies illness that would prevent curative treatment for lung cancer if found. Verified smoking history, (current, 32 pack year). The shared decision making visit was done 09/09/16. Patient is agreeable for CT scan being scheduled.

## 2018-09-16 DIAGNOSIS — R21 Rash and other nonspecific skin eruption: Secondary | ICD-10-CM | POA: Diagnosis not present

## 2018-09-16 DIAGNOSIS — K760 Fatty (change of) liver, not elsewhere classified: Secondary | ICD-10-CM | POA: Diagnosis not present

## 2018-09-20 ENCOUNTER — Ambulatory Visit
Admission: RE | Admit: 2018-09-20 | Discharge: 2018-09-20 | Disposition: A | Discharge status: Home / Self Care | Payer: 59 | Source: Ambulatory Visit | Attending: Oncology | Admitting: Oncology

## 2018-09-20 DIAGNOSIS — Z122 Encounter for screening for malignant neoplasm of respiratory organs: Secondary | ICD-10-CM | POA: Insufficient documentation

## 2018-09-20 DIAGNOSIS — F17210 Nicotine dependence, cigarettes, uncomplicated: Secondary | ICD-10-CM | POA: Diagnosis not present

## 2018-09-20 DIAGNOSIS — Z87891 Personal history of nicotine dependence: Secondary | ICD-10-CM | POA: Diagnosis not present

## 2018-09-20 DIAGNOSIS — E119 Type 2 diabetes mellitus without complications: Secondary | ICD-10-CM | POA: Diagnosis not present

## 2018-09-21 ENCOUNTER — Telehealth: Payer: Self-pay | Admitting: *Deleted

## 2018-09-21 ENCOUNTER — Encounter: Payer: Self-pay | Admitting: *Deleted

## 2018-09-21 NOTE — Telephone Encounter (Signed)
Notified patient of LDCT lung cancer screening program results with recommendation for 12 month follow up imaging. Also notified of incidental findings noted below and is encouraged to discuss further with PCP who will receive a copy of this note and/or the CT report. Patient verbalizes understanding.   IMPRESSION: 1. Lung-RADS 2S, benign appearance or behavior. Continue annual screening with low-dose chest CT without contrast in 12 months. 2. The "S" modifier above refers to potentially clinically significant non lung cancer related findings. Specifically, there is aortic atherosclerosis, in addition to left anterior descending coronary artery disease. Please note that although the presence of coronary artery calcium documents the presence of coronary artery disease, the severity of this disease and any potential stenosis cannot be assessed on this non-gated CT examination. Assessment for potential risk factor modification, dietary therapy or pharmacologic therapy may be warranted, if clinically indicated. 3. Mild diffuse bronchial wall thickening with mild centrilobular and paraseptal emphysema; imaging findings suggestive of underlying COPD. 4. Mild hepatic steatosis.  Aortic Atherosclerosis (ICD10-I70.0) and Emphysema (ICD10-J43.9).

## 2018-09-24 ENCOUNTER — Encounter: Payer: Self-pay | Admitting: *Deleted

## 2018-09-28 DIAGNOSIS — H43313 Vitreous membranes and strands, bilateral: Secondary | ICD-10-CM | POA: Diagnosis not present

## 2018-09-28 DIAGNOSIS — E119 Type 2 diabetes mellitus without complications: Secondary | ICD-10-CM | POA: Diagnosis not present

## 2018-11-01 DIAGNOSIS — K7581 Nonalcoholic steatohepatitis (NASH): Secondary | ICD-10-CM | POA: Diagnosis not present

## 2018-11-04 DIAGNOSIS — G4733 Obstructive sleep apnea (adult) (pediatric): Secondary | ICD-10-CM | POA: Diagnosis not present

## 2018-12-14 DIAGNOSIS — F5104 Psychophysiologic insomnia: Secondary | ICD-10-CM | POA: Diagnosis not present

## 2018-12-14 DIAGNOSIS — Z8639 Personal history of other endocrine, nutritional and metabolic disease: Secondary | ICD-10-CM | POA: Diagnosis not present

## 2018-12-14 DIAGNOSIS — Z1239 Encounter for other screening for malignant neoplasm of breast: Secondary | ICD-10-CM | POA: Diagnosis not present

## 2018-12-14 DIAGNOSIS — Z23 Encounter for immunization: Secondary | ICD-10-CM | POA: Diagnosis not present

## 2018-12-14 DIAGNOSIS — Z0000 Encounter for general adult medical examination without abnormal findings: Secondary | ICD-10-CM | POA: Diagnosis not present

## 2018-12-15 ENCOUNTER — Other Ambulatory Visit: Payer: Self-pay | Admitting: Internal Medicine

## 2018-12-15 DIAGNOSIS — Z1231 Encounter for screening mammogram for malignant neoplasm of breast: Secondary | ICD-10-CM

## 2019-01-06 ENCOUNTER — Other Ambulatory Visit: Payer: Self-pay

## 2019-01-06 ENCOUNTER — Other Ambulatory Visit
Admission: RE | Admit: 2019-01-06 | Discharge: 2019-01-06 | Disposition: A | Discharge status: Home / Self Care | Payer: 59 | Attending: Dermatology | Admitting: Dermatology

## 2019-01-06 DIAGNOSIS — L299 Pruritus, unspecified: Secondary | ICD-10-CM | POA: Insufficient documentation

## 2019-01-06 DIAGNOSIS — L281 Prurigo nodularis: Secondary | ICD-10-CM | POA: Diagnosis not present

## 2019-01-06 DIAGNOSIS — L821 Other seborrheic keratosis: Secondary | ICD-10-CM | POA: Diagnosis not present

## 2019-01-06 DIAGNOSIS — L298 Other pruritus: Secondary | ICD-10-CM | POA: Diagnosis not present

## 2019-01-06 LAB — HEPATIC FUNCTION PANEL
ALT: 46 U/L — ABNORMAL HIGH (ref 0–44)
AST: 55 U/L — ABNORMAL HIGH (ref 15–41)
Albumin: 3.9 g/dL (ref 3.5–5.0)
Alkaline Phosphatase: 240 U/L — ABNORMAL HIGH (ref 38–126)
Bilirubin, Direct: 0.2 mg/dL (ref 0.0–0.2)
Indirect Bilirubin: 0.6 mg/dL (ref 0.3–0.9)
Total Bilirubin: 0.8 mg/dL (ref 0.3–1.2)
Total Protein: 7.2 g/dL (ref 6.5–8.1)

## 2019-01-06 LAB — BASIC METABOLIC PANEL
Anion gap: 6 (ref 5–15)
BUN: 9 mg/dL (ref 8–23)
CO2: 30 mmol/L (ref 22–32)
Calcium: 9.5 mg/dL (ref 8.9–10.3)
Chloride: 104 mmol/L (ref 98–111)
Creatinine, Ser: 0.72 mg/dL (ref 0.44–1.00)
GFR calc Af Amer: >60 mL/min (ref >60)
GFR calc non Af Amer: >60 mL/min (ref >60)
Glucose, Bld: 132 mg/dL — ABNORMAL HIGH (ref 70–99)
Potassium: 5 mmol/L (ref 3.5–5.1)
Sodium: 140 mmol/L (ref 135–145)

## 2019-01-06 LAB — URIC ACID: Uric Acid, Serum: 5.5 mg/dL (ref 2.5–7.1)

## 2019-02-02 DIAGNOSIS — G4733 Obstructive sleep apnea (adult) (pediatric): Secondary | ICD-10-CM | POA: Diagnosis not present

## 2019-02-04 DIAGNOSIS — W1842XA Slipping, tripping and stumbling without falling due to stepping into hole or opening, initial encounter: Secondary | ICD-10-CM | POA: Diagnosis not present

## 2019-02-04 DIAGNOSIS — S99912A Unspecified injury of left ankle, initial encounter: Secondary | ICD-10-CM | POA: Diagnosis not present

## 2019-02-04 DIAGNOSIS — S99911A Unspecified injury of right ankle, initial encounter: Secondary | ICD-10-CM | POA: Diagnosis not present

## 2019-02-04 DIAGNOSIS — M25572 Pain in left ankle and joints of left foot: Secondary | ICD-10-CM | POA: Diagnosis not present

## 2019-02-04 DIAGNOSIS — M7989 Other specified soft tissue disorders: Secondary | ICD-10-CM | POA: Diagnosis not present

## 2019-02-07 DIAGNOSIS — D485 Neoplasm of uncertain behavior of skin: Secondary | ICD-10-CM | POA: Diagnosis not present

## 2019-02-07 DIAGNOSIS — L298 Other pruritus: Secondary | ICD-10-CM | POA: Diagnosis not present

## 2019-02-07 DIAGNOSIS — C44729 Squamous cell carcinoma of skin of left lower limb, including hip: Secondary | ICD-10-CM | POA: Diagnosis not present

## 2019-02-07 DIAGNOSIS — L281 Prurigo nodularis: Secondary | ICD-10-CM | POA: Diagnosis not present

## 2019-02-17 DIAGNOSIS — S82839D Other fracture of upper and lower end of unspecified fibula, subsequent encounter for closed fracture with routine healing: Secondary | ICD-10-CM | POA: Diagnosis not present

## 2019-03-02 DIAGNOSIS — C44729 Squamous cell carcinoma of skin of left lower limb, including hip: Secondary | ICD-10-CM | POA: Diagnosis not present

## 2019-03-09 DIAGNOSIS — I872 Venous insufficiency (chronic) (peripheral): Secondary | ICD-10-CM | POA: Diagnosis not present

## 2019-03-10 DIAGNOSIS — S82839A Other fracture of upper and lower end of unspecified fibula, initial encounter for closed fracture: Secondary | ICD-10-CM | POA: Diagnosis not present

## 2019-03-10 DIAGNOSIS — S99911A Unspecified injury of right ankle, initial encounter: Secondary | ICD-10-CM | POA: Diagnosis not present

## 2019-04-05 DIAGNOSIS — K7581 Nonalcoholic steatohepatitis (NASH): Secondary | ICD-10-CM | POA: Diagnosis not present

## 2019-04-05 DIAGNOSIS — K7689 Other specified diseases of liver: Secondary | ICD-10-CM | POA: Diagnosis not present

## 2019-04-06 DIAGNOSIS — H5213 Myopia, bilateral: Secondary | ICD-10-CM | POA: Diagnosis not present

## 2019-04-06 DIAGNOSIS — E119 Type 2 diabetes mellitus without complications: Secondary | ICD-10-CM | POA: Diagnosis not present

## 2019-05-06 DIAGNOSIS — G4733 Obstructive sleep apnea (adult) (pediatric): Secondary | ICD-10-CM | POA: Diagnosis not present

## 2019-06-13 ENCOUNTER — Other Ambulatory Visit: Payer: Self-pay | Admitting: Physician Assistant

## 2019-06-13 DIAGNOSIS — I10 Essential (primary) hypertension: Secondary | ICD-10-CM | POA: Diagnosis not present

## 2019-06-13 DIAGNOSIS — F5104 Psychophysiologic insomnia: Secondary | ICD-10-CM | POA: Diagnosis not present

## 2019-06-13 DIAGNOSIS — Z1231 Encounter for screening mammogram for malignant neoplasm of breast: Secondary | ICD-10-CM

## 2019-06-13 DIAGNOSIS — E119 Type 2 diabetes mellitus without complications: Secondary | ICD-10-CM | POA: Diagnosis not present

## 2019-06-13 DIAGNOSIS — K760 Fatty (change of) liver, not elsewhere classified: Secondary | ICD-10-CM | POA: Diagnosis not present

## 2019-06-17 DIAGNOSIS — I10 Essential (primary) hypertension: Secondary | ICD-10-CM | POA: Diagnosis not present

## 2019-06-17 DIAGNOSIS — E119 Type 2 diabetes mellitus without complications: Secondary | ICD-10-CM | POA: Diagnosis not present

## 2019-08-04 DIAGNOSIS — G4733 Obstructive sleep apnea (adult) (pediatric): Secondary | ICD-10-CM | POA: Diagnosis not present

## 2019-08-31 ENCOUNTER — Ambulatory Visit
Admission: RE | Admit: 2019-08-31 | Discharge: 2019-08-31 | Disposition: A | Discharge status: Home / Self Care | Payer: 59 | Source: Ambulatory Visit | Attending: Physician Assistant | Admitting: Physician Assistant

## 2019-08-31 DIAGNOSIS — Z1231 Encounter for screening mammogram for malignant neoplasm of breast: Secondary | ICD-10-CM | POA: Diagnosis not present

## 2019-09-14 DIAGNOSIS — L578 Other skin changes due to chronic exposure to nonionizing radiation: Secondary | ICD-10-CM | POA: Diagnosis not present

## 2019-09-14 DIAGNOSIS — Z872 Personal history of diseases of the skin and subcutaneous tissue: Secondary | ICD-10-CM | POA: Diagnosis not present

## 2019-09-14 DIAGNOSIS — L821 Other seborrheic keratosis: Secondary | ICD-10-CM | POA: Diagnosis not present

## 2019-09-14 DIAGNOSIS — Z859 Personal history of malignant neoplasm, unspecified: Secondary | ICD-10-CM | POA: Diagnosis not present

## 2019-09-14 DIAGNOSIS — L570 Actinic keratosis: Secondary | ICD-10-CM | POA: Diagnosis not present

## 2019-09-14 DIAGNOSIS — L281 Prurigo nodularis: Secondary | ICD-10-CM | POA: Diagnosis not present

## 2019-09-20 ENCOUNTER — Other Ambulatory Visit: Payer: Self-pay | Admitting: Physician Assistant

## 2019-10-05 DIAGNOSIS — E119 Type 2 diabetes mellitus without complications: Secondary | ICD-10-CM | POA: Diagnosis not present

## 2019-10-05 DIAGNOSIS — H43313 Vitreous membranes and strands, bilateral: Secondary | ICD-10-CM | POA: Diagnosis not present

## 2019-10-18 DIAGNOSIS — K7581 Nonalcoholic steatohepatitis (NASH): Secondary | ICD-10-CM | POA: Diagnosis not present

## 2019-11-02 DIAGNOSIS — G4733 Obstructive sleep apnea (adult) (pediatric): Secondary | ICD-10-CM | POA: Diagnosis not present

## 2019-11-08 ENCOUNTER — Telehealth: Payer: Self-pay | Admitting: *Deleted

## 2019-11-08 DIAGNOSIS — Z87891 Personal history of nicotine dependence: Secondary | ICD-10-CM

## 2019-11-08 NOTE — Telephone Encounter (Signed)
Patient has been notified that annual lung cancer screening low dose CT scan is due currently or will be in near future. Confirmed that patient is within the age range of 55-77, and asymptomatic, (no signs or symptoms of lung cancer). Patient denies illness that would prevent curative treatment for lung cancer if found. Verified smoking history, (current, 32 pack year). The shared decision making visit was done 09/09/16. Patient is agreeable for CT scan being scheduled.

## 2019-12-06 ENCOUNTER — Ambulatory Visit
Admission: RE | Admit: 2019-12-06 | Discharge: 2019-12-06 | Disposition: A | Discharge status: Home / Self Care | Payer: 59 | Source: Ambulatory Visit | Attending: Nurse Practitioner | Admitting: Nurse Practitioner

## 2019-12-06 ENCOUNTER — Other Ambulatory Visit: Payer: Self-pay

## 2019-12-06 DIAGNOSIS — Z87891 Personal history of nicotine dependence: Secondary | ICD-10-CM | POA: Insufficient documentation

## 2019-12-06 DIAGNOSIS — F17210 Nicotine dependence, cigarettes, uncomplicated: Secondary | ICD-10-CM | POA: Diagnosis not present

## 2019-12-08 ENCOUNTER — Ambulatory Visit: Payer: 59

## 2019-12-08 DIAGNOSIS — I10 Essential (primary) hypertension: Secondary | ICD-10-CM | POA: Diagnosis not present

## 2019-12-08 DIAGNOSIS — E782 Mixed hyperlipidemia: Secondary | ICD-10-CM | POA: Diagnosis not present

## 2019-12-08 DIAGNOSIS — E119 Type 2 diabetes mellitus without complications: Secondary | ICD-10-CM | POA: Diagnosis not present

## 2019-12-12 ENCOUNTER — Encounter: Payer: Self-pay | Admitting: *Deleted

## 2019-12-15 DIAGNOSIS — I10 Essential (primary) hypertension: Secondary | ICD-10-CM | POA: Diagnosis not present

## 2019-12-15 DIAGNOSIS — Z124 Encounter for screening for malignant neoplasm of cervix: Secondary | ICD-10-CM | POA: Diagnosis not present

## 2019-12-15 DIAGNOSIS — Z0000 Encounter for general adult medical examination without abnormal findings: Secondary | ICD-10-CM | POA: Diagnosis not present

## 2019-12-15 DIAGNOSIS — E119 Type 2 diabetes mellitus without complications: Secondary | ICD-10-CM | POA: Diagnosis not present

## 2019-12-15 DIAGNOSIS — E782 Mixed hyperlipidemia: Secondary | ICD-10-CM | POA: Diagnosis not present

## 2019-12-15 DIAGNOSIS — R311 Benign essential microscopic hematuria: Secondary | ICD-10-CM | POA: Diagnosis not present

## 2020-01-31 DIAGNOSIS — G4733 Obstructive sleep apnea (adult) (pediatric): Secondary | ICD-10-CM | POA: Diagnosis not present

## 2020-04-18 DIAGNOSIS — H5213 Myopia, bilateral: Secondary | ICD-10-CM | POA: Diagnosis not present

## 2020-04-18 DIAGNOSIS — E119 Type 2 diabetes mellitus without complications: Secondary | ICD-10-CM | POA: Diagnosis not present

## 2020-05-01 DIAGNOSIS — G4733 Obstructive sleep apnea (adult) (pediatric): Secondary | ICD-10-CM | POA: Diagnosis not present

## 2020-05-18 ENCOUNTER — Other Ambulatory Visit: Payer: Self-pay | Admitting: Nurse Practitioner

## 2020-05-18 DIAGNOSIS — R110 Nausea: Secondary | ICD-10-CM | POA: Diagnosis not present

## 2020-06-06 DIAGNOSIS — E782 Mixed hyperlipidemia: Secondary | ICD-10-CM | POA: Diagnosis not present

## 2020-06-06 DIAGNOSIS — K760 Fatty (change of) liver, not elsewhere classified: Secondary | ICD-10-CM | POA: Diagnosis not present

## 2020-06-06 DIAGNOSIS — K7460 Unspecified cirrhosis of liver: Secondary | ICD-10-CM | POA: Diagnosis not present

## 2020-06-06 DIAGNOSIS — E119 Type 2 diabetes mellitus without complications: Secondary | ICD-10-CM | POA: Diagnosis not present

## 2020-06-06 DIAGNOSIS — I10 Essential (primary) hypertension: Secondary | ICD-10-CM | POA: Diagnosis not present

## 2020-06-08 ENCOUNTER — Other Ambulatory Visit: Payer: Self-pay | Admitting: Physician Assistant

## 2020-06-13 DIAGNOSIS — I10 Essential (primary) hypertension: Secondary | ICD-10-CM | POA: Diagnosis not present

## 2020-06-13 DIAGNOSIS — R5383 Other fatigue: Secondary | ICD-10-CM | POA: Diagnosis not present

## 2020-06-13 DIAGNOSIS — Z0000 Encounter for general adult medical examination without abnormal findings: Secondary | ICD-10-CM | POA: Diagnosis not present

## 2020-06-13 DIAGNOSIS — E782 Mixed hyperlipidemia: Secondary | ICD-10-CM | POA: Diagnosis not present

## 2020-06-13 DIAGNOSIS — E119 Type 2 diabetes mellitus without complications: Secondary | ICD-10-CM | POA: Diagnosis not present

## 2020-06-20 ENCOUNTER — Other Ambulatory Visit: Payer: Self-pay | Admitting: Physician Assistant

## 2020-06-20 DIAGNOSIS — K760 Fatty (change of) liver, not elsewhere classified: Secondary | ICD-10-CM

## 2020-06-20 DIAGNOSIS — E119 Type 2 diabetes mellitus without complications: Secondary | ICD-10-CM

## 2020-06-20 DIAGNOSIS — K7460 Unspecified cirrhosis of liver: Secondary | ICD-10-CM

## 2020-06-22 ENCOUNTER — Ambulatory Visit
Admission: RE | Admit: 2020-06-22 | Discharge: 2020-06-22 | Disposition: A | Discharge status: Home / Self Care | Payer: 59 | Source: Ambulatory Visit | Attending: Physician Assistant | Admitting: Physician Assistant

## 2020-06-22 ENCOUNTER — Other Ambulatory Visit: Payer: Self-pay

## 2020-06-22 DIAGNOSIS — K760 Fatty (change of) liver, not elsewhere classified: Secondary | ICD-10-CM | POA: Insufficient documentation

## 2020-06-22 DIAGNOSIS — E119 Type 2 diabetes mellitus without complications: Secondary | ICD-10-CM | POA: Insufficient documentation

## 2020-06-22 DIAGNOSIS — K7689 Other specified diseases of liver: Secondary | ICD-10-CM | POA: Diagnosis not present

## 2020-06-22 DIAGNOSIS — K7460 Unspecified cirrhosis of liver: Secondary | ICD-10-CM | POA: Insufficient documentation

## 2020-06-22 DIAGNOSIS — Z9049 Acquired absence of other specified parts of digestive tract: Secondary | ICD-10-CM | POA: Diagnosis not present

## 2020-07-17 DIAGNOSIS — Z760 Encounter for issue of repeat prescription: Secondary | ICD-10-CM | POA: Diagnosis not present

## 2020-07-31 DIAGNOSIS — G4733 Obstructive sleep apnea (adult) (pediatric): Secondary | ICD-10-CM | POA: Diagnosis not present

## 2020-08-24 ENCOUNTER — Other Ambulatory Visit: Payer: Self-pay

## 2020-08-24 ENCOUNTER — Encounter: Payer: Self-pay | Admitting: Gastroenterology

## 2020-08-24 ENCOUNTER — Ambulatory Visit (INDEPENDENT_AMBULATORY_CARE_PROVIDER_SITE_OTHER): Payer: 59 | Admitting: Gastroenterology

## 2020-08-24 ENCOUNTER — Other Ambulatory Visit
Admission: RE | Admit: 2020-08-24 | Discharge: 2020-08-24 | Disposition: A | Discharge status: Home / Self Care | Payer: 59 | Attending: Gastroenterology | Admitting: Gastroenterology

## 2020-08-24 VITALS — BP 142/85 | HR 87 | Temp 98.0°F | Ht 62.0 in | Wt 235.5 lb

## 2020-08-24 DIAGNOSIS — K7581 Nonalcoholic steatohepatitis (NASH): Secondary | ICD-10-CM

## 2020-08-24 DIAGNOSIS — K7460 Unspecified cirrhosis of liver: Secondary | ICD-10-CM | POA: Insufficient documentation

## 2020-08-24 DIAGNOSIS — M48061 Spinal stenosis, lumbar region without neurogenic claudication: Secondary | ICD-10-CM | POA: Insufficient documentation

## 2020-08-24 DIAGNOSIS — M1990 Unspecified osteoarthritis, unspecified site: Secondary | ICD-10-CM | POA: Insufficient documentation

## 2020-08-24 DIAGNOSIS — G629 Polyneuropathy, unspecified: Secondary | ICD-10-CM | POA: Insufficient documentation

## 2020-08-24 LAB — IRON AND TIBC
Iron: 66 ug/dL (ref 28–170)
Saturation Ratios: 17 % (ref 10.4–31.8)
TIBC: 393 ug/dL (ref 250–450)
UIBC: 327 ug/dL

## 2020-08-24 LAB — HEPATITIS C ANTIBODY: HCV Ab: NONREACTIVE

## 2020-08-24 LAB — HEPATITIS A ANTIBODY, TOTAL: hep A Total Ab: NONREACTIVE

## 2020-08-24 LAB — FERRITIN: Ferritin: 51 ng/mL (ref 11–307)

## 2020-08-24 LAB — HEPATITIS B CORE ANTIBODY, TOTAL: Hep B Core Total Ab: NONREACTIVE

## 2020-08-24 NOTE — Progress Notes (Signed)
Patricia Darby, MD [address]  [place]  [place], [place] [address]  Main: [phone]  Fax: [phone]    Gastroenterology Consultation  Referring Provider:     Marinda Elk, MD Primary Care Physician:  Marinda Elk, MD Primary Gastroenterologist:  Dr. Cephas [name] Reason for Consultation:     Patricia [name] cirrhosis        HPI:   Patricia [name] is a 65 y.o. female referred by Dr. Carrie Mew, Wilhemena Durie, MD  for consultation & management of Patricia [name] cirrhosis.  Patient is diagnosed with fatty liver, biopsy-proven, at [facility] in 07/2017.  History of metabolic syndrome, spinal stenosis.  Patient was in clinical trials at [facility] in 2019 for fatty liver.  She was enrolled to try obeticholic acid as well as other medication and the trial ended due to lack of benefit.  Patient did develop elevated liver enzymes as part of second clinical trial which resulted in elevated alkaline phosphatase and pruritus in 2019.  Patient is an Therapist, sports, works at [employer], currently working remotely as a Copy.  She managed to lose about 10 to 15 pounds before the holidays.  She regained 10 pounds during holidays.  She does acknowledge that she manages not to eat all day but when she sees the food, she loses control.  She does smoke cigarettes.  She does not drink alcohol.  She denies any swelling of legs, abdominal pain, nausea or vomiting.  She is on Dexilant 30 mg daily for chronic GERD.  She had history of cholecystectomy and appendectomy.  NSAIDs: None  Antiplts/Anticoagulants/Anti thrombotics: None  GI Procedures: Colonoscopy by Dr. Vira Agar in 05/07/2018 DIAGNOSIS:  A. COLON POLYP, HEPATIC FLEXURE; HOT SNARE:  - HYPERPLASTIC POLYP.  - NEGATIVE FOR DYSPLASIA AND MALIGNANCY.   B. COLON POLYP, CECUM; HOT SNARE:  - TUBULAR ADENOMA.  - FECAL DEBRIS.  - NEGATIVE FOR HIGH-GRADE DYSPLASIA AND MALIGNANCY.   C. COLON POLYP, ASCENDING; HOT SNARE:  - SESSILE SERRATED ADENOMA.  - NEGATIVE  FOR HIGH-GRADE DYSPLASIA AND MALIGNANCY.   D. COLON POLYP X2, SIGMOID; COLD BIOPSY:  - HYPERPLASTIC POLYP (3).  - NEGATIVE FOR DYSPLASIA AND MALIGNANCY.  Upper endoscopy 11/03/2014 DIAGNOSIS:  A. STOMACH, ANTRUM; MUCOSAL BIOPSIES:  - REACTIVE GASTROPATHY.  - NO HELICOBACTER PYLORI ORGANISMS OR DYSPLASIA SEEN.  - SEE COMMENT.   B. STOMACH, BODY; MUCOSAL BIOPSIES:  - SUPERFICIAL CHRONIC GASTRITIS, MILD AND INACTIVE.  - NO HELICOBACTER PYLORI ORGANISMS OR DYSPLASIA SEEN.   Colonoscopy 04/06/2013 Diagnosis:  Part A: DISTAL SIGMOID COLON POLYP COLD BIOPSY:  - HYPERPLASTIC POLYP.  - NEGATIVE FOR DYSPLASIA AND MALIGNANCY.  .  Part B: PROXIMAL SIGMOID COLON POLYP COLD BIOPSY:  - HYPERPLASTIC POLYP.  - NEGATIVE FOR DYSPLASIA AND MALIGNANCY.    Past Medical History:  Diagnosis Date  . Allergy   . Cancer (HCC)    cervical  . Depression   . Diabetes mellitus without complication ([place])   . Gastritis 2016  . GERD (gastroesophageal reflux disease)   . Hepatic disease   . Hyperlipidemia   . Hypertension   . NASH (nonalcoholic steatohepatitis)   . Sleep apnea   . Spinal stenosis     Past Surgical History:  Procedure Laterality Date  . APPENDECTOMY    . BREAST BIOPSY Right 2006   bx/clip-neg  . CARPAL TUNNEL RELEASE Right   . CERVICAL DISCECTOMY    . CHOLECYSTECTOMY    . COLONOSCOPY WITH PROPOFOL N/A 05/07/2018   Procedure: COLONOSCOPY WITH  PROPOFOL;  Surgeon: Patricia Silvas, MD;  Location: [facility] ENDOSCOPY;  Service: Endoscopy;  Laterality: N/A;  . KNEE ARTHROSCOPY Bilateral   . ROTATOR CUFF REPAIR Right   . TONSILLECTOMY    . uterine ablation      Current Outpatient Medications:  .  albuterol (PROVENTIL HFA;VENTOLIN HFA) 108 (90 Base) MCG/ACT inhaler, Inhale 2 puffs into the lungs every 6 (six) hours as needed for wheezing or shortness of breath., Disp: 1 Inhaler, Rfl: 0 .  atorvastatin (LIPITOR) 20 MG tablet, Take by mouth., Disp: , Rfl:  .  Cysteamine Bitartrate  (PROCYSBI) 300 MG PACK, Use 1 each 3 (three) times daily Use as instructed., Disp: , Rfl:  .  Dexlansoprazole 30 MG capsule, TAKE 1 CAPSULE (30 MG TOTAL) BY MOUTH ONCE DAILY., Disp: , Rfl:  .  Dulaglutide 0.75 MG/0.5ML SOPN, Inject into the skin., Disp: , Rfl:  .  FREESTYLE LITE test strip, 1 each 3 (three) times daily., Disp: , Rfl:  .  furosemide (LASIX) 20 MG tablet, Take 20 mg by mouth daily as needed for edema., Disp: , Rfl:  .  gabapentin (NEURONTIN) 300 MG capsule, Take 600 mg by mouth at bedtime. Takes 378m only, Disp: , Rfl:  .  Glucosamine-Chondroitin 250-200 MG TABS, Take 1 tablet by mouth 2 (two) times daily. , Disp: , Rfl:  .  glucose monitoring kit (FREESTYLE) monitoring kit, See admin instructions., Disp: , Rfl:  .  levocetirizine (XYZAL) 5 MG tablet, Take by mouth., Disp: , Rfl:  .  methocarbamol (ROBAXIN) 750 MG tablet, Take 1 tablet by mouth at bedtime., Disp: , Rfl:  .  ondansetron (ZOFRAN) 4 MG tablet, Take 1 tablet (4 mg total) by mouth daily as needed., Disp: 10 tablet, Rfl: 0 .  propranolol ER (INDERAL LA) 60 MG 24 hr capsule, TAKE ONE CAPSULE BY MOUTH ONCE DAILY, Disp: , Rfl:  .  sodium fluoride (FLUORISHIELD) 1.1 % GEL dental gel, Apply topically., Disp: , Rfl:  .  zolpidem (AMBIEN) 5 MG tablet, Take 5 mg by mouth at bedtime., Disp: , Rfl:    Family History  Problem Relation Age of Onset  . Diabetes Father   . Diabetes Sister   . Diabetes Paternal Grandmother   . Heart disease Mother   . Hypertension Mother   . Hyperlipidemia Mother   . Bipolar disorder Daughter   . Breast cancer Neg Hx      Social History   Tobacco Use  . Smoking status: Current Every Day Smoker    Packs/day: 1.00    Years: 30.00    Pack years: 30.00    Types: Cigarettes  . Smokeless tobacco: Never Used  . Tobacco comment: has quit in the past multiple times  Substance Use Topics  . Alcohol use: No    Alcohol/week: 0.0 standard drinks  . Drug use: No    Allergies as of 08/24/2020  - Review Complete 08/24/2020  Allergen Reaction Noted  . Codeine Hives 02/07/2015  . Amlodipine Other (See Comments) 11/07/2015  . Deconsal ii [phenylephrine-guaifenesin]  02/07/2015  . Pantoprazole sodium Nausea And Vomiting 02/07/2015  . Paxil  [paroxetine hcl] Nausea And Vomiting 02/07/2015  . Promethazine Other (See Comments) 02/07/2015  . Pseudoephedrine Other (See Comments) 02/07/2015  . Omeprazole Rash 02/07/2015  . Other Rash and Other (See Comments) 02/07/2015  . Povidone iodine Rash 02/07/2015    Review of Systems:    All systems reviewed and negative except where noted in HPI.   Physical Exam:  BP (!) 142/85 (BP Location: Left Arm, Patient Position: Sitting, Cuff Size: Large)   Pulse 87   Temp 98 F (36.7 C) (Oral)   Ht 5' 2"  (1.575 m)   Wt 235 lb 8 oz (106.8 kg)   BMI 43.07 kg/m  No LMP recorded. Patient is postmenopausal.  General:   Alert,  Well-developed, well-nourished, pleasant and cooperative in NAD Head:  Normocephalic and atraumatic. Eyes:  Sclera clear, no icterus.   Conjunctiva pink. Ears:  Normal auditory acuity. Nose:  No deformity, discharge, or lesions. Mouth:  No deformity or lesions,oropharynx pink & moist. Neck:  Supple; no masses or thyromegaly. Lungs:  Respirations even and unlabored.  Clear throughout to auscultation.   No wheezes, crackles, or rhonchi. No acute distress. Heart:  Regular rate and rhythm; no murmurs, clicks, rubs, or gallops. Abdomen:  Normal bowel sounds. Soft, obese, non-tender and non-distended without masses, hepatosplenomegaly or hernias noted.  No guarding or rebound tenderness.   Rectal: Not performed Msk:  Symmetrical without gross deformities. Good, equal movement & strength bilaterally. Pulses:  Normal pulses noted. Extremities:  No clubbing or edema.  No cyanosis. Neurologic:  Alert and oriented x3;  grossly normal neurologically. Skin:  Intact without significant lesions or rashes. No jaundice. Lymph Nodes:  No  significant cervical adenopathy. Psych:  Alert and cooperative. Normal mood and affect.  Imaging Studies: Reviewed  Assessment and Plan:   AARIAN CLEAVER is a 65 y.o. pleasant Caucasian female with metabolic syndrome, compensated NASH cirrhosis  Compensated cirrhosis, child class A, low meld sodium Recommend secondary liver disease work-up Erythrocytosis secondary to tobacco use No clinical or serologic evidence of portal hypertension, no evidence of thrombocytopenia No evidence of ascites, swelling of legs No evidence of hepatic encephalopathy HCC screening: Check serum AFP levels, ultrasound abdomen on 06/23/2020 did not reveal any liver lesions.  Repeat ultrasound every 6 months Varices/portal hypertensive gastropathy: Unknown, recommend EGD for variceal screening Strongly reiterated regarding the significance of healthy lifestyle with weight loss and exercise in order to prevent progression of cirrhosis and patient expressed understanding  History of colon polyps Recommend surveillance colonoscopy in 2024  Follow up in 3 months   Patricia Darby, MD

## 2020-08-25 LAB — AFP TUMOR MARKER: AFP, Serum, Tumor Marker: 2.3 ng/mL (ref 0.0–8.3)

## 2020-08-25 LAB — ANTI-SMOOTH MUSCLE ANTIBODY, IGG: F-Actin IgG: 6 Units (ref 0–19)

## 2020-08-25 LAB — CERULOPLASMIN: Ceruloplasmin: 31.7 mg/dL (ref 19.0–39.0)

## 2020-08-25 LAB — TISSUE TRANSGLUTAMINASE, IGA: Tissue Transglutaminase Ab, IgA: <2 U/mL (ref 0–3)

## 2020-08-25 LAB — ANA: Anti Nuclear Antibody (ANA): POSITIVE — AB

## 2020-08-25 LAB — MITOCHONDRIAL ANTIBODIES: Mitochondrial M2 Ab, IgG: <20 Units (ref 0.0–20.0)

## 2020-08-25 LAB — MITOCHONDRIAL/SMOOTH MUSCLE AB PNL
F-Actin IgG: 6 Units (ref 0–19)
Mitochondrial M2 Ab, IgG: <20 Units (ref 0.0–20.0)

## 2020-08-25 LAB — HEPATITIS B SURFACE ANTIBODY, QUANTITATIVE: Hep B S AB Quant (Post): 289.6 m[IU]/mL (ref >9.9)

## 2020-08-27 ENCOUNTER — Encounter: Payer: Self-pay | Admitting: Gastroenterology

## 2020-08-28 LAB — ALPHA-1 ANTITRYPSIN PHENOTYPE: A-1 Antitrypsin, Ser: 150 mg/dL (ref 101–187)

## 2020-09-06 ENCOUNTER — Other Ambulatory Visit
Admission: RE | Admit: 2020-09-06 | Discharge: 2020-09-06 | Disposition: A | Discharge status: Home / Self Care | Payer: 59 | Source: Ambulatory Visit | Attending: Gastroenterology | Admitting: Gastroenterology

## 2020-09-06 ENCOUNTER — Other Ambulatory Visit: Payer: Self-pay

## 2020-09-06 DIAGNOSIS — Z01812 Encounter for preprocedural laboratory examination: Secondary | ICD-10-CM | POA: Diagnosis not present

## 2020-09-06 DIAGNOSIS — Z20822 Contact with and (suspected) exposure to covid-19: Secondary | ICD-10-CM | POA: Diagnosis not present

## 2020-09-07 LAB — SARS CORONAVIRUS 2 (TAT 6-24 HRS): SARS Coronavirus 2: NEGATIVE

## 2020-09-10 ENCOUNTER — Encounter
Admission: RE | Disposition: A | Discharge status: Home / Self Care | Payer: Self-pay | Source: Home / Self Care | Attending: Gastroenterology

## 2020-09-10 ENCOUNTER — Ambulatory Visit
Admission: RE | Admit: 2020-09-10 | Discharge: 2020-09-10 | Disposition: A | Discharge status: Home / Self Care | Payer: 59 | Attending: Gastroenterology | Admitting: Gastroenterology

## 2020-09-10 ENCOUNTER — Ambulatory Visit: Payer: 59 | Admitting: Anesthesiology

## 2020-09-10 ENCOUNTER — Encounter: Payer: Self-pay | Admitting: Gastroenterology

## 2020-09-10 DIAGNOSIS — F17210 Nicotine dependence, cigarettes, uncomplicated: Secondary | ICD-10-CM | POA: Diagnosis not present

## 2020-09-10 DIAGNOSIS — Z8249 Family history of ischemic heart disease and other diseases of the circulatory system: Secondary | ICD-10-CM | POA: Insufficient documentation

## 2020-09-10 DIAGNOSIS — E119 Type 2 diabetes mellitus without complications: Secondary | ICD-10-CM | POA: Insufficient documentation

## 2020-09-10 DIAGNOSIS — E785 Hyperlipidemia, unspecified: Secondary | ICD-10-CM | POA: Diagnosis not present

## 2020-09-10 DIAGNOSIS — Z8541 Personal history of malignant neoplasm of cervix uteri: Secondary | ICD-10-CM | POA: Diagnosis not present

## 2020-09-10 DIAGNOSIS — K7460 Unspecified cirrhosis of liver: Secondary | ICD-10-CM

## 2020-09-10 DIAGNOSIS — K219 Gastro-esophageal reflux disease without esophagitis: Secondary | ICD-10-CM | POA: Insufficient documentation

## 2020-09-10 DIAGNOSIS — Z833 Family history of diabetes mellitus: Secondary | ICD-10-CM | POA: Insufficient documentation

## 2020-09-10 DIAGNOSIS — K2289 Other specified disease of esophagus: Secondary | ICD-10-CM | POA: Diagnosis not present

## 2020-09-10 DIAGNOSIS — K7581 Nonalcoholic steatohepatitis (NASH): Secondary | ICD-10-CM | POA: Diagnosis not present

## 2020-09-10 DIAGNOSIS — Z818 Family history of other mental and behavioral disorders: Secondary | ICD-10-CM | POA: Insufficient documentation

## 2020-09-10 DIAGNOSIS — Z8349 Family history of other endocrine, nutritional and metabolic diseases: Secondary | ICD-10-CM | POA: Insufficient documentation

## 2020-09-10 DIAGNOSIS — Z888 Allergy status to other drugs, medicaments and biological substances status: Secondary | ICD-10-CM | POA: Diagnosis not present

## 2020-09-10 DIAGNOSIS — Z79899 Other long term (current) drug therapy: Secondary | ICD-10-CM | POA: Insufficient documentation

## 2020-09-10 DIAGNOSIS — Z885 Allergy status to narcotic agent status: Secondary | ICD-10-CM | POA: Insufficient documentation

## 2020-09-10 DIAGNOSIS — Z8719 Personal history of other diseases of the digestive system: Secondary | ICD-10-CM | POA: Diagnosis not present

## 2020-09-10 DIAGNOSIS — I10 Essential (primary) hypertension: Secondary | ICD-10-CM | POA: Diagnosis not present

## 2020-09-10 DIAGNOSIS — E1140 Type 2 diabetes mellitus with diabetic neuropathy, unspecified: Secondary | ICD-10-CM | POA: Diagnosis not present

## 2020-09-10 HISTORY — PX: ESOPHAGOGASTRODUODENOSCOPY (EGD) WITH PROPOFOL: SHX5813

## 2020-09-10 LAB — GLUCOSE, CAPILLARY: Glucose-Capillary: 98 mg/dL (ref 70–99)

## 2020-09-10 SURGERY — ESOPHAGOGASTRODUODENOSCOPY (EGD) WITH PROPOFOL
Anesthesia: General

## 2020-09-10 MED ORDER — SODIUM CHLORIDE 0.9 % IV SOLN: INTRAVENOUS | Status: DC

## 2020-09-10 MED ORDER — PROPOFOL 500 MG/50ML IV EMUL
INTRAVENOUS | Status: AC
  Filled 2020-09-10: qty 50

## 2020-09-10 MED ORDER — GLYCOPYRROLATE 0.2 MG/ML IJ SOLN
INTRAMUSCULAR | Status: AC
  Filled 2020-09-10: qty 1

## 2020-09-10 MED ORDER — GLYCOPYRROLATE 0.2 MG/ML IJ SOLN
INTRAMUSCULAR | Status: DC | PRN
  Administered 2020-09-10: .2 mg via INTRAVENOUS

## 2020-09-10 MED ORDER — PROPOFOL 500 MG/50ML IV EMUL
INTRAVENOUS | Status: DC | PRN
  Administered 2020-09-10: 200 ug/kg/min via INTRAVENOUS

## 2020-09-10 MED ORDER — PROPOFOL 10 MG/ML IV BOLUS
INTRAVENOUS | Status: DC | PRN
  Administered 2020-09-10: 100 mg via INTRAVENOUS

## 2020-09-10 MED ORDER — LIDOCAINE 2% (20 MG/ML) 5 ML SYRINGE
INTRAMUSCULAR | Status: DC | PRN
  Administered 2020-09-10: 100 mg via INTRAVENOUS

## 2020-09-10 MED ORDER — LIDOCAINE HCL (PF) 2 % IJ SOLN
INTRAMUSCULAR | Status: AC
  Filled 2020-09-10: qty 5

## 2020-09-10 NOTE — Op Note (Signed)
[facility] Gastroenterology Patient Name: Patricia [name] Procedure Date: 09/10/2020 8:25 AM MRN: [id number] Account #: [id number] Date of Birth: Sep 24, 1955 Admit Type: Outpatient Age: 65 Room: [facility] ENDO ROOM 2 Gender: Female Note Status: Finalized Procedure:             Upper GI endoscopy Indications:           Cirrhosis rule out esophageal varices Providers:             Lin Landsman MD, MD Medicines:             General Anesthesia Complications:         No immediate complications. Estimated blood loss: None. Procedure:             Pre-Anesthesia Assessment:                        - Prior to the procedure, a History and Physical was                         performed, and patient medications and allergies were                         reviewed. The patient is competent. The risks and                         benefits of the procedure and the sedation options and                         risks were discussed with the patient. All questions                         were answered and informed consent was obtained.                         Patient identification and proposed procedure were                         verified by the physician, the nurse, the                         anesthesiologist, the anesthetist and the technician                         in the pre-procedure area in the procedure room in the                         endoscopy suite. Mental Status Examination: alert and                         oriented. Airway Examination: normal oropharyngeal                         airway and neck mobility. Respiratory Examination:                         clear to auscultation. CV Examination: normal.                         Prophylactic Antibiotics: The patient does  not require                         prophylactic antibiotics. Prior Anticoagulants: The                         patient has taken no previous anticoagulant or                         antiplatelet agents.  ASA Grade Assessment: III - A                         patient with severe systemic disease. After reviewing                         the risks and benefits, the patient was deemed in                         satisfactory condition to undergo the procedure. The                         anesthesia plan was to use general anesthesia.                         Immediately prior to administration of medications,                         the patient was re-assessed for adequacy to receive                         sedatives. The heart rate, respiratory rate, oxygen                         saturations, blood pressure, adequacy of pulmonary                         ventilation, and response to care were monitored                         throughout the procedure. The physical status of the                         patient was re-assessed after the procedure.                        After obtaining informed consent, the endoscope was                         passed under direct vision. Throughout the procedure,                         the patient's blood pressure, pulse, and oxygen                         saturations were monitored continuously. The Endoscope                         was introduced through the mouth, and advanced to the  second part of duodenum. The upper GI endoscopy was                         accomplished without difficulty. The patient tolerated                         the procedure well. Findings:      The duodenal bulb and second portion of the duodenum were normal.      The entire examined stomach was normal.      The cardia and gastric fundus were normal on retroflexion.      Esophagogastric landmarks were identified: the gastroesophageal junction       was found at 36 cm from the incisors.      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      The gastroesophageal junction was normal. Impression:            -  Normal duodenal bulb and second portion of the                         duodenum.                        - Normal stomach.                        - Esophagogastric landmarks identified.                        - Ectopic gastric mucosa in the upper third of the                         esophagus.                        - Normal gastroesophageal junction.                        - No specimens collected. Recommendation:        - Discharge patient to home (with escort).                        - Low fat diet and low sodium diet indefinitely.                        - Continue present medications.                        - Return to my office as previously scheduled. Procedure Code(s):     --- Professional ---                        [phone], Esophagogastroduodenoscopy, flexible,                         transoral; diagnostic, including collection of                         specimen(s) by brushing or washing, when performed                         (  separate procedure) Diagnosis Code(s):     --- Professional ---                        Q40.2, Other specified congenital malformations of                         stomach                        K74.60, Unspecified cirrhosis of liver CPT copyright 2019 American Medical Association. All rights reserved. The codes documented in this report are preliminary and upon coder review may  be revised to meet current compliance requirements. Dr. Ulyess Mort Lin Landsman MD, MD 09/10/2020 10:29:39 AM This report has been signed electronically. Number of Addenda: 0 Note Initiated On: 09/10/2020 8:25 AM Estimated Blood Loss:  Estimated blood loss: none.      [facility]

## 2020-09-10 NOTE — Transfer of Care (Signed)
Immediate Anesthesia Transfer of Care Note  Patient: Patricia [name]  Procedure(s) Performed: ESOPHAGOGASTRODUODENOSCOPY (EGD) WITH PROPOFOL (N/A )  Patient Location: Endoscopy Unit  Anesthesia Type:General  Level of Consciousness: awake  Airway & Oxygen Therapy: Patient Spontanous Breathing and Patient connected to nasal cannula oxygen  Post-op Assessment: Post -op Vital signs reviewed and stable  Post vital signs: stable  Last Vitals:  Vitals Value Taken Time  BP 88/58 09/10/20 1033  Temp 36.6 C 09/10/20 1031  Pulse 89 09/10/20 1034  Resp 21 09/10/20 1034  SpO2 99 % 09/10/20 1034  Vitals shown include unvalidated device data.  Last Pain:  Vitals:   09/10/20 1031  TempSrc: Temporal  PainSc: Asleep         Complications: No complications documented.

## 2020-09-10 NOTE — Anesthesia Preprocedure Evaluation (Signed)
Anesthesia Evaluation  Patient identified by MRN, date of birth, ID band Patient awake    Reviewed: Allergy & Precautions, H&P , NPO status , Patient's Chart, lab work & pertinent test results  History of Anesthesia Complications Negative for: history of anesthetic complications  Airway Mallampati: III  TM Distance: >3 FB     Dental  (+) Teeth Intact   Pulmonary sleep apnea and Continuous Positive Airway Pressure Ventilation , neg COPD, Current Smoker,    breath sounds clear to auscultation       Cardiovascular hypertension, (-) angina(-) Past MI and (-) Cardiac Stents (-) dysrhythmias  Rhythm:regular Rate:Normal     Neuro/Psych PSYCHIATRIC DISORDERS Depression negative neurological ROS     GI/Hepatic GERD  Controlled,(+) Cirrhosis  ([facility])      ,   Endo/Other  diabetesMorbid obesity  Renal/GU negative Renal ROS  negative genitourinary   Musculoskeletal  (+) Arthritis ,   Abdominal   Peds  Hematology negative hematology ROS (+)   Anesthesia Other Findings Past Medical History: No date: Allergy No date: Cancer [place])     Comment:  cervical No date: Depression No date: Diabetes mellitus without complication ([place]) 1173: Gastritis No date: GERD (gastroesophageal reflux disease) No date: Hepatic disease No date: Hyperlipidemia No date: Hypertension No date: NASH (nonalcoholic steatohepatitis) No date: NASH (nonalcoholic steatohepatitis) No date: Sleep apnea No date: Spinal stenosis  Past Surgical History: No date: APPENDECTOMY 2006: BREAST BIOPSY; Right     Comment:  bx/clip-neg No date: CARPAL TUNNEL RELEASE; Right No date: CERVICAL DISCECTOMY No date: CHOLECYSTECTOMY 05/07/2018: COLONOSCOPY WITH PROPOFOL; N/A     Comment:  Procedure: COLONOSCOPY WITH PROPOFOL;  Surgeon: Manya Silvas, MD;  Location: [facility] ENDOSCOPY;  Service:               Endoscopy;  Laterality: N/A; No date: KNEE  ARTHROSCOPY; Bilateral No date: LIVER BIOPSY No date: ROTATOR CUFF REPAIR; Right No date: TONSILLECTOMY No date: uterine ablation  BMI    Body Mass Index: 42.07 kg/m      Reproductive/Obstetrics negative OB ROS                             Anesthesia Physical Anesthesia Plan  ASA: III  Anesthesia Plan: General   Post-op Pain Management:    Induction:   PONV Risk Score and Plan: Propofol infusion and TIVA  Airway Management Planned:   Additional Equipment:   Intra-op Plan:   Post-operative Plan:   Informed Consent: I have reviewed the patients History and Physical, chart, labs and discussed the procedure including the risks, benefits and alternatives for the proposed anesthesia with the patient or authorized representative who has indicated his/her understanding and acceptance.     Dental Advisory Given  Plan Discussed with: Anesthesiologist, CRNA and Surgeon  Anesthesia Plan Comments:         Anesthesia Quick Evaluation

## 2020-09-10 NOTE — H&P (Signed)
Cephas Darby, MD [address]  [place]  [place], [place] [address]  Main: [phone]  Fax: [phone] Pager: [phone]  Primary Care Physician:  Marinda Elk, MD Primary Gastroenterologist:  Dr. Cephas Darby  Pre-Procedure History & Physical: HPI:  Patricia [name] is a 65 y.o. female is here for an endoscopy.   Past Medical History:  Diagnosis Date  . Allergy   . Cancer (HCC)    cervical  . Depression   . Diabetes mellitus without complication ([place])   . Gastritis 2016  . GERD (gastroesophageal reflux disease)   . Hepatic disease   . Hyperlipidemia   . Hypertension   . NASH (nonalcoholic steatohepatitis)   . NASH (nonalcoholic steatohepatitis)   . Sleep apnea   . Spinal stenosis     Past Surgical History:  Procedure Laterality Date  . APPENDECTOMY    . BREAST BIOPSY Right 2006   bx/clip-neg  . CARPAL TUNNEL RELEASE Right   . CERVICAL DISCECTOMY    . CHOLECYSTECTOMY    . COLONOSCOPY WITH PROPOFOL N/A 05/07/2018   Procedure: COLONOSCOPY WITH PROPOFOL;  Surgeon: Manya Silvas, MD;  Location: [facility] ENDOSCOPY;  Service: Endoscopy;  Laterality: N/A;  . KNEE ARTHROSCOPY Bilateral   . LIVER BIOPSY    . ROTATOR CUFF REPAIR Right   . TONSILLECTOMY    . uterine ablation      Prior to Admission medications   Medication Sig Start Date End Date Taking? Authorizing Provider  atorvastatin (LIPITOR) 20 MG tablet Take by mouth. 06/23/18  Yes [provider]  Dexlansoprazole 30 MG capsule TAKE 1 CAPSULE (30 MG TOTAL) BY MOUTH ONCE DAILY. 01/17/15  Yes [provider]  Dulaglutide 0.75 MG/0.5ML SOPN Inject into the skin.   Yes [provider]  furosemide (LASIX) 20 MG tablet Take 20 mg by mouth daily as needed for edema.   Yes [provider]  gabapentin (NEURONTIN) 300 MG capsule Take 600 mg by mouth at bedtime. Takes 31m only   Yes [provider]  Glucosamine-Chondroitin 250-200 MG TABS Take 1 tablet by  mouth 2 (two) times daily.    Yes [provider]  levocetirizine (XYZAL) 5 MG tablet Take by mouth. 10/18/18  Yes [provider]  methocarbamol (ROBAXIN) 750 MG tablet Take 1 tablet by mouth at bedtime. 11/26/15  Yes [provider]  ondansetron (ZOFRAN) 4 MG tablet Take 1 tablet (4 mg total) by mouth daily as needed. 11/07/15  Yes SOrbie Pyo MD  propranolol ER (INDERAL LA) 60 MG 24 hr capsule TAKE ONE CAPSULE BY MOUTH ONCE DAILY 06/05/14  Yes [provider]  sodium fluoride (FLUORISHIELD) 1.1 % GEL dental gel Apply topically. 12/12/16  Yes [provider]  zolpidem (AMBIEN) 5 MG tablet Take 5 mg by mouth at bedtime.   Yes [provider]  albuterol (PROVENTIL HFA;VENTOLIN HFA) 108 (90 Base) MCG/ACT inhaler Inhale 2 puffs into the lungs every 6 (six) hours as needed for wheezing or shortness of breath. Patient not taking: Reported on 09/10/2020 11/07/15   SOrbie Pyo MD  Cysteamine Bitartrate (PROCYSBI) 300 MG PACK Use 1 each 3 (three) times daily Use as instructed. Patient not taking: Reported on 09/10/2020 09/20/19 09/19/20  [provider]  FREESTYLE LITE test strip 1 each 3 (three) times daily. 06/08/20   [provider]  glucose monitoring kit (FREESTYLE) monitoring kit See admin instructions. 09/20/19 09/19/20  [provider]    Allergies as of 08/24/2020 -  Review Complete 08/24/2020  Allergen Reaction Noted  . Codeine Hives 02/07/2015  . Amlodipine Other (See Comments) 11/07/2015  . Deconsal ii [phenylephrine-guaifenesin]  02/07/2015  . Pantoprazole sodium Nausea And Vomiting 02/07/2015  . Paxil  [paroxetine hcl] Nausea And Vomiting 02/07/2015  . Promethazine Other (See Comments) 02/07/2015  . Pseudoephedrine Other (See Comments) 02/07/2015  . Omeprazole Rash 02/07/2015  . Other Rash and Other (See Comments) 02/07/2015  . Povidone iodine Rash 02/07/2015    Family History  Problem  Relation Age of Onset  . Diabetes Father   . Diabetes Sister   . Diabetes Paternal Grandmother   . Heart disease Mother   . Hypertension Mother   . Hyperlipidemia Mother   . Bipolar disorder Daughter   . Breast cancer Neg Hx     Social History   Socioeconomic History  . Marital status: Widowed    Spouse name: Not on file  . Number of children: Not on file  . Years of education: Not on file  . Highest education level: Not on file  Occupational History  . Not on file  Tobacco Use  . Smoking status: Current Every Day Smoker    Packs/day: 1.00    Years: 30.00    Pack years: 30.00    Types: Cigarettes  . Smokeless tobacco: Never Used  . Tobacco comment: has quit in the past multiple times  Vaping Use  . Vaping Use: Never used  Substance and Sexual Activity  . Alcohol use: No    Alcohol/week: 0.0 standard drinks  . Drug use: No  . Sexual activity: Not on file  Other Topics Concern  . Not on file  Social History Narrative  . Not on file   Social Determinants of Health   Financial Resource Strain: Not on file  Food Insecurity: Not on file  Transportation Needs: Not on file  Physical Activity: Not on file  Stress: Not on file  Social Connections: Not on file  Intimate Partner Violence: Not on file    Review of Systems: See HPI, otherwise negative ROS  Physical Exam: BP 132/77   Pulse 79   Temp (!) 96.6 F (35.9 C) (Temporal)   Resp 18   Ht 5' 2"  (1.575 m)   Wt 104.3 kg   SpO2 99%   BMI 42.07 kg/m  General:   Alert,  pleasant and cooperative in NAD Head:  Normocephalic and atraumatic. Neck:  Supple; no masses or thyromegaly. Lungs:  Clear throughout to auscultation.    Heart:  Regular rate and rhythm. Abdomen:  Soft, nontender and nondistended. Normal bowel sounds, without guarding, and without rebound.   Neurologic:  Alert and  oriented x4;  grossly normal neurologically.  Impression/Plan: Patricia [name] is here for an endoscopy to be performed  for variceal screening  Risks, benefits, limitations, and alternatives regarding  endoscopy have been reviewed with the patient.  Questions have been answered.  All parties agreeable.   Sherri Sear, MD  09/10/2020, 9:36 AM

## 2020-09-11 ENCOUNTER — Encounter: Payer: Self-pay | Admitting: Gastroenterology

## 2020-09-11 ENCOUNTER — Other Ambulatory Visit: Payer: Self-pay | Admitting: Physician Assistant

## 2020-09-11 NOTE — Anesthesia Postprocedure Evaluation (Signed)
Anesthesia Post Note  Patient: Patricia [name]  Procedure(s) Performed: ESOPHAGOGASTRODUODENOSCOPY (EGD) WITH PROPOFOL (N/A )  Patient location during evaluation: PACU Anesthesia Type: General Level of consciousness: awake and alert Pain management: pain level controlled Vital Signs Assessment: post-procedure vital signs reviewed and stable Respiratory status: spontaneous breathing, nonlabored ventilation and respiratory function stable Cardiovascular status: blood pressure returned to baseline and stable Postop Assessment: no apparent nausea or vomiting Anesthetic complications: no   No complications documented.   Last Vitals:  Vitals:   09/10/20 1051 09/10/20 1059  BP: (!) 111/59 (!) 109/48  Pulse: 73 65  Resp: 14 18  Temp:    SpO2: 98% 99%    Last Pain:  Vitals:   09/11/20 0746  TempSrc:   PainSc: 0-No pain                 Brett Canales Dannya Pitkin

## 2020-09-12 ENCOUNTER — Other Ambulatory Visit: Payer: Self-pay | Admitting: Physician Assistant

## 2020-09-13 DIAGNOSIS — L821 Other seborrheic keratosis: Secondary | ICD-10-CM | POA: Diagnosis not present

## 2020-09-13 DIAGNOSIS — Z872 Personal history of diseases of the skin and subcutaneous tissue: Secondary | ICD-10-CM | POA: Diagnosis not present

## 2020-09-13 DIAGNOSIS — L578 Other skin changes due to chronic exposure to nonionizing radiation: Secondary | ICD-10-CM | POA: Diagnosis not present

## 2020-09-13 DIAGNOSIS — L570 Actinic keratosis: Secondary | ICD-10-CM | POA: Diagnosis not present

## 2020-09-13 DIAGNOSIS — Z859 Personal history of malignant neoplasm, unspecified: Secondary | ICD-10-CM | POA: Diagnosis not present

## 2020-09-24 ENCOUNTER — Other Ambulatory Visit: Payer: Self-pay | Admitting: Oncology

## 2020-09-24 MED ORDER — BUPROPION HCL ER (SR) 150 MG PO TB12: 150.0000 mg | ORAL_TABLET | Freq: Two times a day (BID) | ORAL | 1 refills | Status: DC

## 2020-09-24 NOTE — Progress Notes (Unsigned)
Smoking Cessation [place]  [phone] [phone]  Patient Care Team: Marinda Elk, MD as PCP - General (Physician Assistant)   Name of the patient: Patricia [name]  [id number]  Jan 11, 1956   Date of visit: 09/24/2020   Diagnosis- There are no diagnoses linked to this encounter.   Chief complaint/ Reason for visit- Smoking Cessation counseling  PMH-  Interval history- Patricia [name] is a *** yo patient who presents to smoking cessation clinic to discuss strategies and techniques to quit smoking.  The goal of this program is to implement proven ineffective interventions in a clinical setting to successfully quit smoking.   During our visit today, we will discuss strategies for tobacco cessation.   Smoking history: Tobacco Use: High Risk  . Smoking Tobacco Use: Current Every Day Smoker  . Smokeless Tobacco Use: Never Used     Type of tobacco:   Cigarettes  Pipe  Cigars  Smokeless  Approximate date of last quit attempt:   Longest period of time quit in the past:   What caused the relapse:  Medication used in the past:  Nicotine patch  Nicotine gum  Nicotine lozenge  Nicotine nasal spray  Nicotine oral inhaler  Bupropion  Nortriptyline  Chantix  Readiness to quit: Not interested/would like to quit sometime soon/would like to quit now or soon  Other smokers in household: Y/N  Plan: Quit date:  Today,   Denies any neurologic complaints. Denies recent fevers or illnesses. Denies any easy bleeding or bruising. Reports good appetite and denies weight loss. Denies chest pain. Denies any nausea, vomiting, constipation, or diarrhea. Denies urinary complaints. Patient offers no further specific complaints today.   ECOG FS:{CHL ONC [id number]}  Review of systems- ROS    Allergies  Allergen Reactions  . Codeine Hives  . Amlodipine Other (See Comments)    Reaction: weakness, sleep   .  Deconsal Ii [Phenylephrine-Guaifenesin]     Other reaction(s): Unknown   . Pantoprazole Sodium Nausea And Vomiting  . Paxil  [Paroxetine Hcl] Nausea And Vomiting  . Promethazine Other (See Comments)    Reaction: "involuntary jerking" - fenergan  . Pseudoephedrine Other (See Comments)    Reaction: Dysesthesia   . Omeprazole Rash  . Other Rash and Other (See Comments)    D-Vaseo, Dola Factor  Reaction: nausea and vomiting   . Povidone Iodine Rash     Past Medical History:  Diagnosis Date  . Allergy   . Cancer (HCC)    cervical  . Depression   . Diabetes mellitus without complication ([place])   . Gastritis 2016  . GERD (gastroesophageal reflux disease)   . Hepatic disease   . Hyperlipidemia   . Hypertension   . NASH (nonalcoholic steatohepatitis)   . NASH (nonalcoholic steatohepatitis)   . Sleep apnea   . Spinal stenosis      Past Surgical History:  Procedure Laterality Date  . APPENDECTOMY    . BREAST BIOPSY Right 2006   bx/clip-neg  . CARPAL TUNNEL RELEASE Right   . CERVICAL DISCECTOMY    . CHOLECYSTECTOMY    . COLONOSCOPY WITH PROPOFOL N/A 05/07/2018   Procedure: COLONOSCOPY WITH PROPOFOL;  Surgeon: Manya Silvas, MD;  Location[facility] ENDOSCOPY;  Service: Endoscopy;  Laterality: N/A;  . ESOPHAGOGASTRODUODENOSCOPY (EGD) WITH PROPOFOL N/A 09/10/2020   Procedure: ESOPHAGOGASTRODUODENOSCOPY (EGD) WITH PROPOFOL;  Surgeon: Lin Landsman, MD;  Location: [facility] ENDOSCOPY;  Service: Gastroenterology;  Laterality: N/A;  . KNEE ARTHROSCOPY  Bilateral   . LIVER BIOPSY    . ROTATOR CUFF REPAIR Right   . TONSILLECTOMY    . uterine ablation      Social History   Socioeconomic History  . Marital status: Widowed    Spouse name: Not on file  . Number of children: Not on file  . Years of education: Not on file  . Highest education level: Not on file  Occupational History  . Not on file  Tobacco Use  . Smoking status: Current Every Day Smoker    Packs/day: 1.00     Years: 30.00    Pack years: 30.00    Types: Cigarettes  . Smokeless tobacco: Never Used  . Tobacco comment: has quit in the past multiple times  Vaping Use  . Vaping Use: Never used  Substance and Sexual Activity  . Alcohol use: No    Alcohol/week: 0.0 standard drinks  . Drug use: No  . Sexual activity: Not on file  Other Topics Concern  . Not on file  Social History Narrative  . Not on file   Social Determinants of Health   Financial Resource Strain: Not on file  Food Insecurity: Not on file  Transportation Needs: Not on file  Physical Activity: Not on file  Stress: Not on file  Social Connections: Not on file  Intimate Partner Violence: Not on file    Family History  Problem Relation Age of Onset  . Diabetes Father   . Diabetes Sister   . Diabetes Paternal Grandmother   . Heart disease Mother   . Hypertension Mother   . Hyperlipidemia Mother   . Bipolar disorder Daughter   . Breast cancer Neg Hx      Current Outpatient Medications:  .  albuterol (PROVENTIL HFA;VENTOLIN HFA) 108 (90 Base) MCG/ACT inhaler, Inhale 2 puffs into the lungs every 6 (six) hours as needed for wheezing or shortness of breath. (Patient not taking: Reported on 09/10/2020), Disp: 1 Inhaler, Rfl: 0 .  atorvastatin (LIPITOR) 20 MG tablet, Take by mouth., Disp: , Rfl:  .  Dexlansoprazole 30 MG capsule, TAKE 1 CAPSULE (30 MG TOTAL) BY MOUTH ONCE DAILY., Disp: , Rfl:  .  Dulaglutide 0.75 MG/0.5ML SOPN, Inject into the skin., Disp: , Rfl:  .  FREESTYLE LITE test strip, 1 each 3 (three) times daily., Disp: , Rfl:  .  furosemide (LASIX) 20 MG tablet, Take 20 mg by mouth daily as needed for edema., Disp: , Rfl:  .  gabapentin (NEURONTIN) 300 MG capsule, Take 600 mg by mouth at bedtime. Takes 351m only, Disp: , Rfl:  .  Glucosamine-Chondroitin 250-200 MG TABS, Take 1 tablet by mouth 2 (two) times daily. , Disp: , Rfl:  .  levocetirizine (XYZAL) 5 MG tablet, Take by mouth., Disp: , Rfl:  .  methocarbamol  (ROBAXIN) 750 MG tablet, Take 1 tablet by mouth at bedtime., Disp: , Rfl:  .  ondansetron (ZOFRAN) 4 MG tablet, Take 1 tablet (4 mg total) by mouth daily as needed., Disp: 10 tablet, Rfl: 0 .  propranolol ER (INDERAL LA) 60 MG 24 hr capsule, TAKE ONE CAPSULE BY MOUTH ONCE DAILY, Disp: , Rfl:  .  sodium fluoride (FLUORISHIELD) 1.1 % GEL dental gel, Apply topically., Disp: , Rfl:  .  zolpidem (AMBIEN) 5 MG tablet, Take 5 mg by mouth at bedtime., Disp: , Rfl:   Physical exam: There were no vitals filed for this visit. Physical Exam   CMP Latest Ref Rng & Units 01/06/2019  Glucose 70 - 99 mg/dL 132(H)  BUN 8 - 23 mg/dL 9  Creatinine 0.44 - 1.00 mg/dL 0.72  Sodium 135 - 145 mmol/L 140  Potassium 3.5 - 5.1 mmol/L 5.0  Chloride 98 - 111 mmol/L 104  CO2 22 - 32 mmol/L 30  Calcium 8.9 - 10.3 mg/dL 9.5  Total Protein 6.5 - 8.1 g/dL 7.2  Total Bilirubin 0.3 - 1.2 mg/dL 0.8  Alkaline Phos 38 - 126 U/L 240(H)  AST 15 - 41 U/L 55(H)  ALT 0 - 44 U/L 46(H)   CBC Latest Ref Rng & Units 12/25/2017  WBC 3.6 - 11.0 K/uL 11.5(H)  Hemoglobin 12.0 - 16.0 g/dL 16.2(H)  Hematocrit 35.0 - 47.0 % 48.3(H)  Platelets 150 - 440 K/uL 147(L)    No images are attached to the encounter.  No results found.   Assessment and plan- Patient is a 64 y.o. femalewho presents to smoking cessation clinic to discuss strategies, medications and interventions needed to quit smoking.  Topics covered: Tobacco improve having car Change in daily routines Dealing with urges to smoke Getting support Anticipating/avoiding triggers Secondhand smoke Behavioral skills Reinforced benefits  Reviewed pharmacotherapy: OTC medications include:  Nicotine replacement therapy (NRT) gum  NRT lozenge  NRT patch  NRT patch plus combination of gum or lozenge  Medical treatment:  NRT nasal spray: Dosing 1-2 doses per hour (8-40 doses per day); 1 dose equals 1 spray in each nostril; each spray level 0.5 mg of nicotine  NRT oral  inhaler: Dosing 6-16 cartridges per day; initially use 1 cartridge every 1-2 hours  Bupropion SR: Dosing: Begin 1 to 2 weeks prior to quit date; 150 mg by mouth every a.m. x3 days, then increase to 150 mg by mouth twice daily.  Contraindications: Head injury, seizures, eating disorders, MAOI inhibitor.  Verenicline: Dosing: Begin 1 week prior to quit date; days 1 through 3 0.5 mg by mouth every a.m.; days 4 through 7; 0.5 mg p.o. twice daily; weeks 2 through 12: 1 mg p.o. twice daily.  Monitor for neuropsychiatric symptoms.  Follow-up plan:  Based on our discussion today, you have decided to initiate *** Based on our discussion today, your quit date is***.  We will plan to speak by telephone on day of quit date to verify that you have your medications and understand how to take them.  We will plan to follow-up in approximately 1 week.   Disposition:     Visit Diagnosis No diagnosis found.  Patient expressed understanding and was in agreement with this plan. She also understands that She can call clinic at any time with any questions, concerns, or complaints.   Greater than 50% was spent in counseling and coordination of care with this patient including but not limited to discussion of the relevant topics above (See A&P) including, but not limited to diagnosis and management of acute and chronic medical conditions.   Thank you for allowing me to participate in the care of this very pleasant patient.    Jacquelin Hawking, NP [place] [facility] Cell - [phone] Pager- [phone] 09/24/2020 3:50 PM

## 2020-09-27 ENCOUNTER — Other Ambulatory Visit: Payer: Self-pay | Admitting: Physician Assistant

## 2020-10-29 DIAGNOSIS — G4733 Obstructive sleep apnea (adult) (pediatric): Secondary | ICD-10-CM | POA: Diagnosis not present

## 2020-11-19 ENCOUNTER — Other Ambulatory Visit: Payer: Self-pay | Admitting: Oncology

## 2020-11-20 ENCOUNTER — Other Ambulatory Visit: Payer: Self-pay

## 2020-11-20 MED ORDER — BUPROPION HCL ER (SR) 150 MG PO TB12
ORAL_TABLET | Freq: Two times a day (BID) | ORAL | 1 refills | Status: DC
  Filled 2020-11-20: qty 60, 30d supply, fill #0
  Filled 2020-12-23: qty 60, 30d supply, fill #1

## 2020-11-21 ENCOUNTER — Other Ambulatory Visit: Payer: Self-pay | Admitting: *Deleted

## 2020-11-21 DIAGNOSIS — Z87891 Personal history of nicotine dependence: Secondary | ICD-10-CM

## 2020-11-21 DIAGNOSIS — Z122 Encounter for screening for malignant neoplasm of respiratory organs: Secondary | ICD-10-CM

## 2020-11-21 NOTE — Progress Notes (Signed)
Contacted and scheduled for annual lung screening scan. Patient is a former smoker, quit 10/24/20 with 32 pack year history.

## 2020-11-22 ENCOUNTER — Other Ambulatory Visit: Payer: Self-pay | Admitting: Physician Assistant

## 2020-11-22 DIAGNOSIS — Z1231 Encounter for screening mammogram for malignant neoplasm of breast: Secondary | ICD-10-CM

## 2020-11-22 MED FILL — Zolpidem Tartrate Tab 5 MG: ORAL | Qty: 30 | Fill #0 | Status: CN

## 2020-11-23 ENCOUNTER — Other Ambulatory Visit: Payer: Self-pay

## 2020-11-23 MED FILL — Zolpidem Tartrate Tab 5 MG: ORAL | 30 days supply | Qty: 30 | Fill #0 | Status: AC

## 2020-11-27 ENCOUNTER — Other Ambulatory Visit: Payer: Self-pay

## 2020-11-27 DIAGNOSIS — E119 Type 2 diabetes mellitus without complications: Secondary | ICD-10-CM | POA: Diagnosis not present

## 2020-11-27 MED ORDER — PROPRANOLOL HCL ER 60 MG PO CP24
ORAL_CAPSULE | ORAL | 0 refills | Status: DC
  Filled 2020-11-27: qty 90, 90d supply, fill #0

## 2020-11-27 MED ORDER — FUROSEMIDE 20 MG PO TABS
ORAL_TABLET | ORAL | 0 refills | Status: DC
  Filled 2020-11-27: qty 90, 90d supply, fill #0

## 2020-11-27 MED ORDER — GABAPENTIN 300 MG PO CAPS
ORAL_CAPSULE | ORAL | 0 refills | Status: DC
  Filled 2020-11-27: qty 90, 90d supply, fill #0

## 2020-11-27 MED ORDER — GLUCOSE BLOOD VI STRP
ORAL_STRIP | 8 refills | Status: DC
  Filled 2020-11-27: qty 100, 30d supply, fill #0
  Filled 2020-12-23: qty 100, 30d supply, fill #1
  Filled 2021-02-15: qty 100, 30d supply, fill #2
  Filled 2021-04-17: qty 100, 30d supply, fill #3
  Filled 2021-06-04: qty 100, 30d supply, fill #4
  Filled 2021-07-30: qty 100, 30d supply, fill #5
  Filled 2021-11-13: qty 100, 30d supply, fill #6

## 2020-11-27 MED ORDER — METHOCARBAMOL 750 MG PO TABS
ORAL_TABLET | ORAL | 1 refills | Status: DC
  Filled 2020-11-27: qty 90, 90d supply, fill #0
  Filled 2021-06-04: qty 90, 90d supply, fill #1

## 2020-11-29 ENCOUNTER — Other Ambulatory Visit: Payer: Self-pay

## 2020-11-30 ENCOUNTER — Ambulatory Visit: Payer: 59 | Admitting: Gastroenterology

## 2020-11-30 ENCOUNTER — Other Ambulatory Visit: Payer: Self-pay

## 2020-11-30 MED FILL — Atorvastatin Calcium Tab 20 MG (Base Equivalent): ORAL | 30 days supply | Qty: 30 | Fill #0 | Status: AC

## 2020-12-06 ENCOUNTER — Telehealth: Payer: Self-pay

## 2020-12-06 NOTE — Telephone Encounter (Signed)
Patient verbalized understanding  

## 2020-12-06 NOTE — Telephone Encounter (Signed)
Patient called wanting to know if she needed labs before her follow up appointment

## 2020-12-06 NOTE — Telephone Encounter (Signed)
Just routine labs CBC, CMP, can be done when I see her in the office  RV

## 2020-12-07 ENCOUNTER — Other Ambulatory Visit: Payer: Self-pay

## 2020-12-07 ENCOUNTER — Ambulatory Visit
Admission: RE | Admit: 2020-12-07 | Discharge: 2020-12-07 | Disposition: A | Discharge status: Home / Self Care | Payer: 59 | Source: Ambulatory Visit | Attending: Oncology | Admitting: Oncology

## 2020-12-07 ENCOUNTER — Ambulatory Visit: Payer: 59 | Admitting: Gastroenterology

## 2020-12-07 DIAGNOSIS — K7460 Unspecified cirrhosis of liver: Secondary | ICD-10-CM | POA: Diagnosis not present

## 2020-12-07 DIAGNOSIS — Z87891 Personal history of nicotine dependence: Secondary | ICD-10-CM | POA: Diagnosis not present

## 2020-12-07 DIAGNOSIS — E782 Mixed hyperlipidemia: Secondary | ICD-10-CM | POA: Diagnosis not present

## 2020-12-07 DIAGNOSIS — Z122 Encounter for screening for malignant neoplasm of respiratory organs: Secondary | ICD-10-CM | POA: Insufficient documentation

## 2020-12-07 DIAGNOSIS — I10 Essential (primary) hypertension: Secondary | ICD-10-CM | POA: Diagnosis not present

## 2020-12-07 DIAGNOSIS — R5383 Other fatigue: Secondary | ICD-10-CM | POA: Diagnosis not present

## 2020-12-07 DIAGNOSIS — E119 Type 2 diabetes mellitus without complications: Secondary | ICD-10-CM | POA: Diagnosis not present

## 2020-12-14 ENCOUNTER — Encounter: Payer: Self-pay | Admitting: Gastroenterology

## 2020-12-14 ENCOUNTER — Other Ambulatory Visit: Payer: Self-pay

## 2020-12-14 ENCOUNTER — Ambulatory Visit (INDEPENDENT_AMBULATORY_CARE_PROVIDER_SITE_OTHER): Payer: 59 | Admitting: Gastroenterology

## 2020-12-14 ENCOUNTER — Other Ambulatory Visit
Admission: RE | Admit: 2020-12-14 | Discharge: 2020-12-14 | Disposition: A | Discharge status: Home / Self Care | Payer: 59 | Attending: Gastroenterology | Admitting: Gastroenterology

## 2020-12-14 VITALS — BP 135/74 | HR 82 | Temp 98.1°F | Ht 62.0 in | Wt 243.0 lb

## 2020-12-14 DIAGNOSIS — K7581 Nonalcoholic steatohepatitis (NASH): Secondary | ICD-10-CM | POA: Diagnosis not present

## 2020-12-14 DIAGNOSIS — Z8739 Personal history of other diseases of the musculoskeletal system and connective tissue: Secondary | ICD-10-CM

## 2020-12-14 DIAGNOSIS — K7460 Unspecified cirrhosis of liver: Secondary | ICD-10-CM

## 2020-12-14 LAB — VITAMIN D 25 HYDROXY (VIT D DEFICIENCY, FRACTURES): Vit D, 25-Hydroxy: 34.86 ng/mL (ref 30–100)

## 2020-12-14 NOTE — Progress Notes (Signed)
Patricia Darby, MD [address]  [place]  [place], [place] [address]  Main: [phone]  Fax: [phone]    Gastroenterology Consultation  Referring Provider:     Marinda Elk, MD Primary Care Physician:  Marinda Elk, MD Primary Gastroenterologist:  Dr. Cephas [name] Reason for Consultation:     Patricia [name] cirrhosis        HPI:   Patricia [name] is a 65 y.o. female referred by Dr. Carrie Mew, Wilhemena Durie, MD  for consultation & management of Patricia [name] cirrhosis.  Patient is diagnosed with fatty liver, biopsy-proven, at [facility] in 07/2017.  History of metabolic syndrome, spinal stenosis.  Patient was in clinical trials at [facility] in 2019 for fatty liver.  She was enrolled to try obeticholic acid as well as other medication and the trial ended due to lack of benefit.  Patient did develop elevated liver enzymes as part of second clinical trial which resulted in elevated alkaline phosphatase and pruritus in 2019.  Patient is an Therapist, sports, works at [employer], currently working remotely as a Copy.  She managed to lose about 10 to 15 pounds before the holidays.  She regained 10 pounds during holidays.  She does acknowledge that she manages not to eat all day but when she sees the food, she loses control.  She does smoke cigarettes.  She does not drink alcohol.  She denies any swelling of legs, abdominal pain, nausea or vomiting.  She is on Dexilant 30 mg daily for chronic GERD.  She had history of cholecystectomy and appendectomy.  Follow-up visit 12/14/2020 Patricia [name] is here for follow-up of her cirrhosis.  She quit smoking on March 9.  And, she has gained about 20 pounds since then.  She does report episodes of constipation and diarrhea.  She loves baking.  Attributes most of her weight gain to it.  She is following low-sodium diet.  Her most recent LFTs revealed mildly elevated.  She does have history of osteopenia.  NSAIDs: None  Antiplts/Anticoagulants/Anti thrombotics:  None  GI Procedures:  EGD 09/10/2020 - Normal duodenal bulb and second portion of the duodenum. - Normal stomach. - Esophagogastric landmarks identified. - Ectopic gastric mucosa in the upper third of the esophagus. - Normal gastroesophageal junction. - No specimens collected.   Colonoscopy by Dr. Vira Agar in 05/07/2018 DIAGNOSIS:  A. COLON POLYP, HEPATIC FLEXURE; HOT SNARE:  - HYPERPLASTIC POLYP.  - NEGATIVE FOR DYSPLASIA AND MALIGNANCY.   B. COLON POLYP, CECUM; HOT SNARE:  - TUBULAR ADENOMA.  - FECAL DEBRIS.  - NEGATIVE FOR HIGH-GRADE DYSPLASIA AND MALIGNANCY.   C. COLON POLYP, ASCENDING; HOT SNARE:  - SESSILE SERRATED ADENOMA.  - NEGATIVE FOR HIGH-GRADE DYSPLASIA AND MALIGNANCY.   D. COLON POLYP X2, SIGMOID; COLD BIOPSY:  - HYPERPLASTIC POLYP (3).  - NEGATIVE FOR DYSPLASIA AND MALIGNANCY.  Upper endoscopy 11/03/2014 DIAGNOSIS:  A. STOMACH, ANTRUM; MUCOSAL BIOPSIES:  - REACTIVE GASTROPATHY.  - NO HELICOBACTER PYLORI ORGANISMS OR DYSPLASIA SEEN.  - SEE COMMENT.   B. STOMACH, BODY; MUCOSAL BIOPSIES:  - SUPERFICIAL CHRONIC GASTRITIS, MILD AND INACTIVE.  - NO HELICOBACTER PYLORI ORGANISMS OR DYSPLASIA SEEN.   Colonoscopy 04/06/2013 Diagnosis:  Part A: DISTAL SIGMOID COLON POLYP COLD BIOPSY:  - HYPERPLASTIC POLYP.  - NEGATIVE FOR DYSPLASIA AND MALIGNANCY.  .  Part B: PROXIMAL SIGMOID COLON POLYP COLD BIOPSY:  - HYPERPLASTIC POLYP.  - NEGATIVE FOR DYSPLASIA AND MALIGNANCY.    Past Medical History:  Diagnosis Date  . Allergy   .  Cancer (HCC)    cervical  . Depression   . Diabetes mellitus without complication ([place])   . Gastritis 2016  . GERD (gastroesophageal reflux disease)   . Hepatic disease   . Hyperlipidemia   . Hypertension   . NASH (nonalcoholic steatohepatitis)   . NASH (nonalcoholic steatohepatitis)   . Sleep apnea   . Spinal stenosis     Past Surgical History:  Procedure Laterality Date  . APPENDECTOMY    . BREAST BIOPSY Right 2006    bx/clip-neg  . CARPAL TUNNEL RELEASE Right   . CERVICAL DISCECTOMY    . CHOLECYSTECTOMY    . COLONOSCOPY WITH PROPOFOL N/A 05/07/2018   Procedure: COLONOSCOPY WITH PROPOFOL;  Surgeon: Manya Silvas, MD;  [facility] END[facility]COPY;  Service: Endoscopy;  Laterality: N/A;  . ESOPHAGOGASTRODUODENOSCOPY (EGD) WITH PROPOFOL N/A 09/10/2020   Procedure: ESOPHAGOGASTRODUODENOSCOPY (EGD) WITH PROPOFOL;  Surgeon: Lin Landsman, MD;  Location: [facility] ENDOSCOPY;  Service: Gastroenterology;  Laterality: N/A;  . KNEE ARTHROSCOPY Bilateral   . LIVER BIOPSY    . ROTATOR CUFF REPAIR Right   . TONSILLECTOMY    . uterine ablation      Current Outpatient Medications:  .  albuterol (PROVENTIL HFA;VENTOLIN HFA) 108 (90 Base) MCG/ACT inhaler, Inhale 2 puffs into the lungs every 6 (six) hours as needed for wheezing or shortness of breath., Disp: 1 Inhaler, Rfl: 0 .  atorvastatin (LIPITOR) 20 MG tablet, Take by mouth., Disp: , Rfl:  .  buPROPion (WELLBUTRIN SR) 150 MG 12 hr tablet, TAKE 1 TABLET BY MOUTH TWICE DAILY, Disp: 60 tablet, Rfl: 1 .  Dexlansoprazole 30 MG capsule, TAKE 1 CAPSULE BY MOUTH ONCE DAILY, Disp: 90 capsule, Rfl: 0 .  Dulaglutide 1.5 MG/0.5ML SOPN, INJECT 0.5 MLS SUBCUTANEOUSLY EVERY 7 DAYS, Disp: 6 mL, Rfl: 3 .  furosemide (LASIX) 20 MG tablet, Take 1 tablet (20 mg total) by mouth once daily as needed for Edema, Disp: 90 tablet, Rfl: 0 .  gabapentin (NEURONTIN) 300 MG capsule, Take 1 capsule (300 mg total) by mouth nightly, Disp: 90 capsule, Rfl: 0 .  Glucosamine-Chondroitin 250-200 MG TABS, Take 1 tablet by mouth 2 (two) times daily. , Disp: , Rfl:  .  glucose blood test strip, test 3 times a day as instructed, Disp: 100 strip, Rfl: 8 .  levocetirizine (XYZAL) 5 MG tablet, Take by mouth., Disp: , Rfl:  .  methocarbamol (ROBAXIN) 750 MG tablet, Take 1 tablet (750 mg total) by mouth once daily as needed, Disp: 90 tablet, Rfl: 1 .  ondansetron (ZOFRAN) 4 MG tablet, TAKE 1 TABLET BY MOUTH  EVERY EIGHT (8) HOURS AS NEEDED FOR NAUSEA FOR UP TO 7 DAYS., Disp: 20 tablet, Rfl: 0 .  propranolol ER (INDERAL LA) 60 MG 24 hr capsule, Take 1 capsule (60 mg total) by mouth once daily, Disp: 90 capsule, Rfl: 0 .  sodium fluoride (FLUORISHIELD) 1.1 % GEL dental gel, Apply topically., Disp: , Rfl:  .  zolpidem (AMBIEN) 5 MG tablet, TAKE 1 TABLET BY MOUTH NIGHTLY AS NEEDED FOR SLEEP, Disp: 30 tablet, Rfl: 3   Family History  Problem Relation Age of Onset  . Diabetes Father   . Diabetes Sister   . Diabetes Paternal Grandmother   . Heart disease Mother   . Hypertension Mother   . Hyperlipidemia Mother   . Bipolar disorder Daughter   . Breast cancer Neg Hx      Social History   Tobacco Use  . Smoking status: Former Smoker  Packs/day: 1.00    Years: 30.00    Pack years: 30.00    Types: Cigarettes    Quit date: 10/24/2020    Years since quitting: 0.1  . Smokeless tobacco: Never Used  . Tobacco comment: has quit in the past multiple times  Vaping Use  . Vaping Use: Never used  Substance Use Topics  . Alcohol use: No    Alcohol/week: 0.0 standard drinks  . Drug use: No    Allergies as of 12/14/2020 - Review Complete 12/14/2020  Allergen Reaction Noted  . Codeine Hives 02/07/2015  . Amlodipine Other (See Comments) 11/07/2015  . Deconsal ii [phenylephrine-guaifenesin]  02/07/2015  . Pantoprazole sodium Nausea And Vomiting 02/07/2015  . Paxil  [paroxetine hcl] Nausea And Vomiting 02/07/2015  . Promethazine Other (See Comments) 02/07/2015  . Pseudoephedrine Other (See Comments) 02/07/2015  . Omeprazole Rash 02/07/2015  . Other Rash and Other (See Comments) 02/07/2015  . Povidone iodine Rash 02/07/2015    Review of Systems:    All systems reviewed and negative except where noted in HPI.   Physical Exam:  BP 135/74 (BP Location: Left Arm, Patient Position: Sitting, Cuff Size: Normal)   Pulse 82   Temp 98.1 F (36.7 C) (Oral)   Ht 5' 2"  (1.575 m)   Wt 243 lb (110.2 kg)    BMI 44.45 kg/m  No LMP recorded. Patient is postmenopausal.  General:   Alert,  Well-developed, well-nourished, pleasant and cooperative in NAD Head:  Normocephalic and atraumatic. Eyes:  Sclera clear, no icterus.   Conjunctiva pink. Ears:  Normal auditory acuity. Nose:  No deformity, discharge, or lesions. Mouth:  No deformity or lesions,oropharynx pink & moist. Neck:  Supple; no masses or thyromegaly. Lungs:  Respirations even and unlabored.  Clear throughout to auscultation.   No wheezes, crackles, or rhonchi. No acute distress. Heart:  Regular rate and rhythm; no murmurs, clicks, rubs, or gallops. Abdomen:  Normal bowel sounds. Soft, obese, non-tender and non-distended without masses, hepatosplenomegaly or hernias noted.  No guarding or rebound tenderness.   Rectal: Not performed Msk:  Symmetrical without gross deformities. Good, equal movement & strength bilaterally. Pulses:  Normal pulses noted. Extremities:  No clubbing or edema.  No cyanosis. Neurologic:  Alert and oriented x3;  grossly normal neurologically. Skin:  Intact without significant lesions or rashes. No jaundice. Psych:  Alert and cooperative. Normal mood and affect.  Imaging Studies: Reviewed  Assessment and Plan:   Patricia [name] is a 65 y.o. pleasant Caucasian female with metabolic syndrome, compensated NASH cirrhosis  Compensated cirrhosis, child class A, low meld sodium Recommend secondary liver disease work-up Erythrocytosis secondary to tobacco use No clinical or serologic evidence of portal hypertension, no evidence of thrombocytopenia No evidence of ascites, swelling of legs No evidence of hepatic encephalopathy HCC screening: serum AFP levels normal, ultrasound abdomen on 06/23/2020 did not reveal any liver lesions.  Schedule right upper quadrant ultrasound Varices/portal hypertensive gastropathy: Not present based on EGD from 01/08/2021 Strongly reiterated regarding the significance of healthy  lifestyle with weight loss and exercise in order to prevent progression of cirrhosis and patient expressed understanding  History of colon polyps Recommend surveillance colonoscopy in 2024  History of osteopenia Check vitamin D levels  Follow up in 6 months   Patricia Darby, MD

## 2020-12-14 NOTE — Patient Instructions (Addendum)
1. Your RUQ ultrasound is scheduled for 01/02/2021 at 8:15 in [place]. Nothing to eat or drink after midnight.  If you need to rescheduled the ultrasound you can call [phone] option 3 and then option 2.

## 2020-12-18 ENCOUNTER — Other Ambulatory Visit: Payer: Self-pay

## 2020-12-18 ENCOUNTER — Other Ambulatory Visit: Payer: Self-pay | Admitting: Physician Assistant

## 2020-12-18 DIAGNOSIS — Z0000 Encounter for general adult medical examination without abnormal findings: Secondary | ICD-10-CM | POA: Diagnosis not present

## 2020-12-18 DIAGNOSIS — E119 Type 2 diabetes mellitus without complications: Secondary | ICD-10-CM | POA: Diagnosis not present

## 2020-12-18 DIAGNOSIS — N6489 Other specified disorders of breast: Secondary | ICD-10-CM

## 2020-12-18 DIAGNOSIS — I10 Essential (primary) hypertension: Secondary | ICD-10-CM | POA: Diagnosis not present

## 2020-12-18 DIAGNOSIS — Z8669 Personal history of other diseases of the nervous system and sense organs: Secondary | ICD-10-CM | POA: Diagnosis not present

## 2020-12-18 MED ORDER — PROPRANOLOL HCL ER 60 MG PO CP24
ORAL_CAPSULE | ORAL | 3 refills | Status: DC
  Filled 2020-12-18 – 2021-02-27 (×2): qty 90, 90d supply, fill #0
  Filled 2021-06-04: qty 90, 90d supply, fill #1
  Filled 2021-09-01: qty 90, 90d supply, fill #2
  Filled 2021-11-13: qty 90, 90d supply, fill #3

## 2020-12-18 MED ORDER — GABAPENTIN 300 MG PO CAPS
ORAL_CAPSULE | ORAL | 3 refills | Status: DC
  Filled 2020-12-18 – 2021-02-27 (×2): qty 90, 90d supply, fill #0
  Filled 2021-06-04: qty 90, 90d supply, fill #1
  Filled 2021-09-01: qty 90, 90d supply, fill #2
  Filled 2021-11-13: qty 90, 90d supply, fill #3

## 2020-12-18 MED ORDER — FUROSEMIDE 20 MG PO TABS
ORAL_TABLET | ORAL | 3 refills | Status: DC
  Filled 2020-12-18 – 2021-02-27 (×2): qty 90, 90d supply, fill #0
  Filled 2021-06-04: qty 90, 90d supply, fill #1
  Filled 2021-09-01: qty 90, 90d supply, fill #2
  Filled 2021-11-13: qty 90, 90d supply, fill #3

## 2020-12-18 MED ORDER — DEXLANSOPRAZOLE 30 MG PO CPDR
DELAYED_RELEASE_CAPSULE | ORAL | 0 refills | Status: DC
  Filled 2020-12-18 – 2020-12-24 (×3): qty 90, 90d supply, fill #0

## 2020-12-19 ENCOUNTER — Other Ambulatory Visit: Payer: Self-pay

## 2020-12-20 ENCOUNTER — Ambulatory Visit: Payer: 59

## 2020-12-20 ENCOUNTER — Other Ambulatory Visit: Payer: Self-pay

## 2020-12-21 ENCOUNTER — Other Ambulatory Visit: Payer: Self-pay

## 2020-12-23 MED FILL — Zolpidem Tartrate Tab 5 MG: ORAL | 30 days supply | Qty: 30 | Fill #1 | Status: AC

## 2020-12-23 MED FILL — Dulaglutide Soln Auto-injector 1.5 MG/0.5ML: SUBCUTANEOUS | 84 days supply | Qty: 6 | Fill #0 | Status: AC

## 2020-12-24 ENCOUNTER — Other Ambulatory Visit: Payer: Self-pay

## 2020-12-24 ENCOUNTER — Other Ambulatory Visit: Payer: Self-pay | Admitting: Physician Assistant

## 2020-12-24 DIAGNOSIS — N6489 Other specified disorders of breast: Secondary | ICD-10-CM

## 2020-12-24 MED FILL — Atorvastatin Calcium Tab 20 MG (Base Equivalent): ORAL | 90 days supply | Qty: 90 | Fill #1 | Status: AC

## 2020-12-27 ENCOUNTER — Ambulatory Visit
Admission: RE | Admit: 2020-12-27 | Discharge: 2020-12-27 | Disposition: A | Discharge status: Home / Self Care | Payer: 59 | Source: Ambulatory Visit | Attending: Physician Assistant | Admitting: Physician Assistant

## 2020-12-27 ENCOUNTER — Other Ambulatory Visit: Payer: Self-pay

## 2020-12-27 DIAGNOSIS — N6489 Other specified disorders of breast: Secondary | ICD-10-CM | POA: Insufficient documentation

## 2020-12-27 DIAGNOSIS — R928 Other abnormal and inconclusive findings on diagnostic imaging of breast: Secondary | ICD-10-CM | POA: Diagnosis not present

## 2020-12-27 MED ORDER — PREVNAR 13 IM SUSP
INTRAMUSCULAR | 0 refills | Status: DC
  Filled 2020-12-27 (×2): qty 1, 1d supply, fill #0
  Filled 2021-01-04: qty 1, 30d supply, fill #0

## 2021-01-02 ENCOUNTER — Other Ambulatory Visit: Payer: Self-pay

## 2021-01-02 ENCOUNTER — Ambulatory Visit
Admission: RE | Admit: 2021-01-02 | Discharge: 2021-01-02 | Disposition: A | Discharge status: Home / Self Care | Payer: 59 | Source: Ambulatory Visit | Attending: Gastroenterology | Admitting: Gastroenterology

## 2021-01-02 DIAGNOSIS — K7581 Nonalcoholic steatohepatitis (NASH): Secondary | ICD-10-CM | POA: Diagnosis not present

## 2021-01-02 DIAGNOSIS — K7460 Unspecified cirrhosis of liver: Secondary | ICD-10-CM | POA: Diagnosis not present

## 2021-01-02 DIAGNOSIS — K760 Fatty (change of) liver, not elsewhere classified: Secondary | ICD-10-CM | POA: Diagnosis not present

## 2021-01-04 ENCOUNTER — Other Ambulatory Visit: Payer: Self-pay

## 2021-01-28 DIAGNOSIS — G4733 Obstructive sleep apnea (adult) (pediatric): Secondary | ICD-10-CM | POA: Diagnosis not present

## 2021-02-11 DIAGNOSIS — S61012A Laceration without foreign body of left thumb without damage to nail, initial encounter: Secondary | ICD-10-CM | POA: Diagnosis not present

## 2021-02-11 DIAGNOSIS — S6992XA Unspecified injury of left wrist, hand and finger(s), initial encounter: Secondary | ICD-10-CM | POA: Diagnosis not present

## 2021-02-12 ENCOUNTER — Other Ambulatory Visit: Payer: Self-pay

## 2021-02-12 DIAGNOSIS — S61012A Laceration without foreign body of left thumb without damage to nail, initial encounter: Secondary | ICD-10-CM | POA: Diagnosis not present

## 2021-02-12 MED ORDER — DOXYCYCLINE HYCLATE 100 MG PO CAPS
ORAL_CAPSULE | ORAL | 0 refills | Status: DC
  Filled 2021-02-12: qty 20, 10d supply, fill #0

## 2021-02-12 MED ORDER — PREDNISONE 10 MG PO TABS
ORAL_TABLET | ORAL | 0 refills | Status: DC
  Filled 2021-02-12: qty 10, 5d supply, fill #0

## 2021-02-14 ENCOUNTER — Other Ambulatory Visit: Payer: Self-pay

## 2021-02-14 MED ORDER — OXYCODONE HCL 5 MG PO TABS
ORAL_TABLET | ORAL | 0 refills | Status: DC
  Filled 2021-02-14: qty 30, 5d supply, fill #0

## 2021-02-14 MED ORDER — IBUPROFEN 600 MG PO TABS
ORAL_TABLET | ORAL | 2 refills | Status: DC
  Filled 2021-02-14: qty 60, 20d supply, fill #0
  Filled 2021-04-18: qty 60, 20d supply, fill #1
  Filled 2021-06-04: qty 60, 20d supply, fill #2

## 2021-02-14 MED ORDER — ONDANSETRON 4 MG PO TBDP
ORAL_TABLET | ORAL | 0 refills | Status: DC
  Filled 2021-02-14: qty 8, 4d supply, fill #0

## 2021-02-15 ENCOUNTER — Other Ambulatory Visit: Payer: Self-pay

## 2021-02-15 DIAGNOSIS — G4733 Obstructive sleep apnea (adult) (pediatric): Secondary | ICD-10-CM | POA: Diagnosis not present

## 2021-02-15 DIAGNOSIS — S66221A Laceration of extensor muscle, fascia and tendon of right thumb at wrist and hand level, initial encounter: Secondary | ICD-10-CM | POA: Diagnosis not present

## 2021-02-15 DIAGNOSIS — K7581 Nonalcoholic steatohepatitis (NASH): Secondary | ICD-10-CM | POA: Diagnosis not present

## 2021-02-15 DIAGNOSIS — Z885 Allergy status to narcotic agent status: Secondary | ICD-10-CM | POA: Diagnosis not present

## 2021-02-15 DIAGNOSIS — I10 Essential (primary) hypertension: Secondary | ICD-10-CM | POA: Diagnosis not present

## 2021-02-15 DIAGNOSIS — S66322A Laceration of extensor muscle, fascia and tendon of right middle finger at wrist and hand level, initial encounter: Secondary | ICD-10-CM | POA: Diagnosis not present

## 2021-02-15 DIAGNOSIS — M79645 Pain in left finger(s): Secondary | ICD-10-CM | POA: Diagnosis not present

## 2021-02-15 DIAGNOSIS — K7290 Hepatic failure, unspecified without coma: Secondary | ICD-10-CM | POA: Diagnosis not present

## 2021-02-15 DIAGNOSIS — S66222A Laceration of extensor muscle, fascia and tendon of left thumb at wrist and hand level, initial encounter: Secondary | ICD-10-CM | POA: Diagnosis not present

## 2021-02-15 DIAGNOSIS — E119 Type 2 diabetes mellitus without complications: Secondary | ICD-10-CM | POA: Diagnosis not present

## 2021-02-21 DIAGNOSIS — S56311D Strain of extensor or abductor muscles, fascia and tendons of right thumb at forearm level, subsequent encounter: Secondary | ICD-10-CM | POA: Diagnosis not present

## 2021-02-22 ENCOUNTER — Other Ambulatory Visit (HOSPITAL_COMMUNITY): Payer: Self-pay

## 2021-02-27 ENCOUNTER — Other Ambulatory Visit: Payer: Self-pay

## 2021-02-27 MED FILL — Zolpidem Tartrate Tab 5 MG: ORAL | 30 days supply | Qty: 30 | Fill #2 | Status: AC

## 2021-03-07 DIAGNOSIS — S56311D Strain of extensor or abductor muscles, fascia and tendons of right thumb at forearm level, subsequent encounter: Secondary | ICD-10-CM | POA: Diagnosis not present

## 2021-03-17 ENCOUNTER — Other Ambulatory Visit: Payer: Self-pay

## 2021-03-17 MED FILL — Dulaglutide Soln Auto-injector 1.5 MG/0.5ML: SUBCUTANEOUS | 84 days supply | Qty: 6 | Fill #1 | Status: CN

## 2021-03-17 MED FILL — Atorvastatin Calcium Tab 20 MG (Base Equivalent): ORAL | 60 days supply | Qty: 60 | Fill #2 | Status: CN

## 2021-03-18 ENCOUNTER — Other Ambulatory Visit: Payer: Self-pay

## 2021-03-18 MED ORDER — DEXLANSOPRAZOLE 30 MG PO CPDR
DELAYED_RELEASE_CAPSULE | ORAL | 1 refills | Status: DC
  Filled 2021-03-18 (×2): qty 90, 90d supply, fill #0
  Filled 2021-06-04: qty 90, 90d supply, fill #1

## 2021-03-18 MED ORDER — ZOLPIDEM TARTRATE 5 MG PO TABS
5.0000 mg | ORAL_TABLET | Freq: Every evening | ORAL | 3 refills | Status: DC | PRN
  Filled 2021-04-18: qty 30, 30d supply, fill #0
  Filled 2021-06-04: qty 30, 30d supply, fill #1
  Filled 2021-07-30: qty 30, 30d supply, fill #2
  Filled 2021-09-01: qty 30, 30d supply, fill #3

## 2021-03-18 MED FILL — Dulaglutide Soln Auto-injector 1.5 MG/0.5ML: SUBCUTANEOUS | 84 days supply | Qty: 6 | Fill #1 | Status: AC

## 2021-03-19 ENCOUNTER — Other Ambulatory Visit: Payer: Self-pay

## 2021-03-20 ENCOUNTER — Other Ambulatory Visit: Payer: Self-pay

## 2021-03-21 ENCOUNTER — Other Ambulatory Visit: Payer: Self-pay

## 2021-03-21 MED FILL — Atorvastatin Calcium Tab 20 MG (Base Equivalent): ORAL | 60 days supply | Qty: 60 | Fill #2 | Status: AC

## 2021-03-26 DIAGNOSIS — S56311D Strain of extensor or abductor muscles, fascia and tendons of right thumb at forearm level, subsequent encounter: Secondary | ICD-10-CM | POA: Diagnosis not present

## 2021-04-08 DIAGNOSIS — Z760 Encounter for issue of repeat prescription: Secondary | ICD-10-CM | POA: Diagnosis not present

## 2021-04-17 ENCOUNTER — Other Ambulatory Visit: Payer: Self-pay

## 2021-04-17 MED ORDER — ATORVASTATIN CALCIUM 20 MG PO TABS
20.0000 mg | ORAL_TABLET | Freq: Every day | ORAL | 3 refills | Status: DC
  Filled 2021-04-17 – 2021-06-04 (×3): qty 90, 90d supply, fill #0
  Filled 2021-09-01: qty 90, 90d supply, fill #1
  Filled 2021-11-13: qty 90, 90d supply, fill #2
  Filled 2022-02-13: qty 90, 90d supply, fill #3

## 2021-04-18 ENCOUNTER — Other Ambulatory Visit: Payer: Self-pay

## 2021-04-23 DIAGNOSIS — S56311D Strain of extensor or abductor muscles, fascia and tendons of right thumb at forearm level, subsequent encounter: Secondary | ICD-10-CM | POA: Diagnosis not present

## 2021-05-01 DIAGNOSIS — G4733 Obstructive sleep apnea (adult) (pediatric): Secondary | ICD-10-CM | POA: Diagnosis not present

## 2021-05-07 ENCOUNTER — Other Ambulatory Visit: Payer: Self-pay

## 2021-05-07 MED ORDER — PREVNAR 20 0.5 ML IM SUSY
PREFILLED_SYRINGE | INTRAMUSCULAR | 0 refills | Status: DC
  Filled 2021-05-07 – 2021-05-15 (×2): qty 0.5, 1d supply, fill #0

## 2021-05-08 ENCOUNTER — Other Ambulatory Visit: Payer: Self-pay

## 2021-05-15 ENCOUNTER — Other Ambulatory Visit: Payer: Self-pay

## 2021-06-04 ENCOUNTER — Other Ambulatory Visit: Payer: Self-pay

## 2021-06-05 ENCOUNTER — Other Ambulatory Visit: Payer: Self-pay

## 2021-06-06 ENCOUNTER — Other Ambulatory Visit: Payer: Self-pay

## 2021-06-10 ENCOUNTER — Other Ambulatory Visit: Payer: Self-pay

## 2021-06-10 MED ORDER — DULAGLUTIDE 1.5 MG/0.5ML ~~LOC~~ SOAJ
SUBCUTANEOUS | 3 refills | Status: DC
  Filled 2021-06-10: qty 2, 28d supply, fill #0

## 2021-06-11 ENCOUNTER — Other Ambulatory Visit: Payer: Self-pay

## 2021-06-13 DIAGNOSIS — E119 Type 2 diabetes mellitus without complications: Secondary | ICD-10-CM | POA: Diagnosis not present

## 2021-06-13 DIAGNOSIS — K760 Fatty (change of) liver, not elsewhere classified: Secondary | ICD-10-CM | POA: Diagnosis not present

## 2021-06-13 DIAGNOSIS — I10 Essential (primary) hypertension: Secondary | ICD-10-CM | POA: Diagnosis not present

## 2021-06-18 ENCOUNTER — Other Ambulatory Visit: Payer: Self-pay

## 2021-06-18 ENCOUNTER — Ambulatory Visit: Payer: 59 | Attending: Internal Medicine

## 2021-06-18 DIAGNOSIS — Z23 Encounter for immunization: Secondary | ICD-10-CM

## 2021-06-18 MED ORDER — PFIZER COVID-19 VAC BIVALENT 30 MCG/0.3ML IM SUSP
INTRAMUSCULAR | 0 refills | Status: DC
  Filled 2021-06-18: qty 0.3, 1d supply, fill #0

## 2021-06-18 NOTE — Progress Notes (Signed)
   Covid-19 Vaccination Clinic  Name:  Patricia [name]    MRN: [id number] DOB: [birth year]  06/18/2021  [name] was observed post Covid-19 immunization for 15 minutes without incident. She was provided with Vaccine Information Sheet and instruction to access the V-Safe system.   [name] was instructed to call 911 with any severe reactions post vaccine: Difficulty breathing  Swelling of face and throat  A fast heartbeat  A bad rash all over body  Dizziness and weakness   Immunizations Administered     Name Date Dose VIS Date Route   Pfizer Covid-19 Vaccine Bivalent Booster 06/18/2021 12:19 PM 0.3 mL 04/17/2021 Intramuscular   Manufacturer: [place]   Lot: [place]: [id number]      Lu Duffel, PharmD, MBA Clinical Acute Care Pharmacist

## 2021-06-20 ENCOUNTER — Other Ambulatory Visit: Payer: Self-pay

## 2021-06-20 DIAGNOSIS — K7460 Unspecified cirrhosis of liver: Secondary | ICD-10-CM | POA: Diagnosis not present

## 2021-06-20 DIAGNOSIS — I10 Essential (primary) hypertension: Secondary | ICD-10-CM | POA: Diagnosis not present

## 2021-06-20 DIAGNOSIS — E119 Type 2 diabetes mellitus without complications: Secondary | ICD-10-CM | POA: Diagnosis not present

## 2021-06-20 DIAGNOSIS — F5104 Psychophysiologic insomnia: Secondary | ICD-10-CM | POA: Diagnosis not present

## 2021-06-20 MED ORDER — TRULICITY 3 MG/0.5ML ~~LOC~~ SOAJ
SUBCUTANEOUS | 5 refills | Status: DC
  Filled 2021-06-20: qty 2, 28d supply, fill #0
  Filled 2021-07-16: qty 2, 28d supply, fill #1
  Filled 2021-08-06: qty 2, 28d supply, fill #2
  Filled 2021-09-01: qty 6, 84d supply, fill #3

## 2021-06-21 ENCOUNTER — Ambulatory Visit: Payer: 59 | Admitting: Gastroenterology

## 2021-06-21 ENCOUNTER — Other Ambulatory Visit: Payer: Self-pay

## 2021-06-28 ENCOUNTER — Ambulatory Visit (INDEPENDENT_AMBULATORY_CARE_PROVIDER_SITE_OTHER): Payer: 59 | Admitting: Gastroenterology

## 2021-06-28 ENCOUNTER — Other Ambulatory Visit: Payer: Self-pay

## 2021-06-28 ENCOUNTER — Encounter: Payer: Self-pay | Admitting: Gastroenterology

## 2021-06-28 ENCOUNTER — Telehealth: Payer: Self-pay

## 2021-06-28 VITALS — BP 130/71 | HR 88 | Temp 98.1°F | Ht 62.0 in | Wt 253.0 lb

## 2021-06-28 DIAGNOSIS — K7460 Unspecified cirrhosis of liver: Secondary | ICD-10-CM

## 2021-06-28 DIAGNOSIS — K7581 Nonalcoholic steatohepatitis (NASH): Secondary | ICD-10-CM

## 2021-06-28 DIAGNOSIS — R2243 Localized swelling, mass and lump, lower limb, bilateral: Secondary | ICD-10-CM | POA: Diagnosis not present

## 2021-06-28 MED ORDER — SPIRONOLACTONE 50 MG PO TABS
50.0000 mg | ORAL_TABLET | Freq: Every day | ORAL | 3 refills | Status: DC
  Filled 2021-06-28: qty 90, 90d supply, fill #0
  Filled 2021-09-01: qty 90, 90d supply, fill #1
  Filled 2021-11-13 – 2021-12-04 (×2): qty 90, 90d supply, fill #2
  Filled 2022-02-16: qty 90, 90d supply, fill #3

## 2021-06-28 NOTE — Progress Notes (Signed)
Patricia Darby, MD [address]  [place]  [place], [place] [address]  Main: [phone]  Fax: [phone]    Gastroenterology Consultation  Referring Provider:     Marinda Elk, MD Primary Care Physician:  Marinda Elk, MD Primary Gastroenterologist:  Dr. Cephas [name] Reason for Consultation:     Patricia [name] cirrhosis        HPI:   Patricia [name] is a 65 y.o. female referred by Dr. Carrie Mew, Wilhemena Durie, MD  for consultation & management of Patricia [name] cirrhosis.  Patient is diagnosed with fatty liver, biopsy-proven, at [facility] in 07/2017.  History of metabolic syndrome, spinal stenosis.  Patient was in clinical trials at [facility] in 2019 for fatty liver.  She was enrolled to try obeticholic acid as well as other medication and the trial ended due to lack of benefit.  Patient did develop elevated liver enzymes as part of second clinical trial which resulted in elevated alkaline phosphatase and pruritus in 2019.  Patient is an Therapist, sports, works at [employer], currently working remotely as a Copy.  She managed to lose about 10 to 15 pounds before the holidays.  She regained 10 pounds during holidays.  She does acknowledge that she manages not to eat all day but when she sees the food, she loses control.  She does smoke cigarettes.  She does not drink alcohol.  She denies any swelling of legs, abdominal pain, nausea or vomiting.  She is on Dexilant 30 mg daily for chronic GERD.  She had history of cholecystectomy and appendectomy.  Follow-up visit 12/14/2020 [name] is here for follow-up of her cirrhosis.  She quit smoking on March 9.  And, she has gained about 20 pounds since then.  She does report episodes of constipation and diarrhea.  She loves baking.  Attributes most of her weight gain to it.  She is following low-sodium diet.  Her most recent LFTs revealed mildly elevated.  She does have history of osteopenia.  Follow-up visit 06/28/2021 Patient is here for follow-up of  cirrhosis.  Patient reports mild swelling in her ankles, she is currently on Lasix 20 mg daily.  Her hemoglobin A1c went up to 7.1.  Her LFTs are mildly elevated.  She underwent secondary liver disease work-up which was unremarkable.  She is adherent to low-sodium diet  NSAIDs: None  Antiplts/Anticoagulants/Anti thrombotics: None  GI Procedures:  EGD 09/10/2020 - Normal duodenal bulb and second portion of the duodenum. - Normal stomach. - Esophagogastric landmarks identified. - Ectopic gastric mucosa in the upper third of the esophagus. - Normal gastroesophageal junction. - No specimens collected.   Colonoscopy by Dr. Vira Agar in 05/07/2018 DIAGNOSIS:  A. COLON POLYP, HEPATIC FLEXURE; HOT SNARE:  - HYPERPLASTIC POLYP.  - NEGATIVE FOR DYSPLASIA AND MALIGNANCY.   B.  COLON POLYP, CECUM; HOT SNARE:  - TUBULAR ADENOMA.  - FECAL DEBRIS.  - NEGATIVE FOR HIGH-GRADE DYSPLASIA AND MALIGNANCY.   C.  COLON POLYP, ASCENDING; HOT SNARE:  - SESSILE SERRATED ADENOMA.  - NEGATIVE FOR HIGH-GRADE DYSPLASIA AND MALIGNANCY.   D.  COLON POLYP X2, SIGMOID; COLD BIOPSY:  - HYPERPLASTIC POLYP (3).  - NEGATIVE FOR DYSPLASIA AND MALIGNANCY.  Upper endoscopy 11/03/2014 DIAGNOSIS:  A.  STOMACH, ANTRUM; MUCOSAL BIOPSIES:  - REACTIVE GASTROPATHY.  - NO HELICOBACTER PYLORI ORGANISMS OR DYSPLASIA SEEN.  - SEE COMMENT.   B.  STOMACH, BODY; MUCOSAL BIOPSIES:  - SUPERFICIAL CHRONIC GASTRITIS, MILD AND INACTIVE.  - NO HELICOBACTER PYLORI  ORGANISMS OR DYSPLASIA SEEN.   Colonoscopy 04/06/2013 Diagnosis:  Part A: DISTAL SIGMOID COLON POLYP COLD BIOPSY:  - HYPERPLASTIC POLYP.  - NEGATIVE FOR DYSPLASIA AND MALIGNANCY.  .  Part B: PROXIMAL SIGMOID COLON POLYP COLD BIOPSY:  - HYPERPLASTIC POLYP.  - NEGATIVE FOR DYSPLASIA AND MALIGNANCY.    Past Medical History:  Diagnosis Date   Allergy    Cancer ([place])    cervical   Current smoker 08/22/2016   Depression    Diabetes mellitus without complication  ([place])    Gastritis 2016   GERD (gastroesophageal reflux disease)    Hepatic disease    Hyperlipidemia    Hypertension    NASH (nonalcoholic steatohepatitis)    NASH (nonalcoholic steatohepatitis)    Sleep apnea    Spinal stenosis     Past Surgical History:  Procedure Laterality Date   APPENDECTOMY     BREAST BIOPSY Right 2006   bx/clip-neg   CARPAL TUNNEL RELEASE Right    CERVICAL DISCECTOMY     CHOLECYSTECTOMY     COLONOSCOPY WITH PROPOFOL N/A 05/07/2018   Procedure: COLONOSCOPY WITH PROPOFOL;  Surgeon: Manya Silvas, MD;  Location: [facility] ENDOSCOPY;  Service: Endoscopy;  Laterality: N/A;   ESOPHAGOGASTRODUODENOSCOPY (EGD) WITH PROPOFOL N/A 09/10/2020   Procedure: ESOPHAGOGASTRODUODENOSCOPY (EGD) WITH PROPOFOL;  Surgeon: Lin Landsman, MD;  Location: [facility] ENDOSCOPY;  Service: Gastroenterology;  Laterality: N/A;   KNEE ARTHROSCOPY Bilateral    LIVER BIOPSY     ROTATOR CUFF REPAIR Right    TONSILLECTOMY     uterine ablation      Current Outpatient Medications:    atorvastatin (LIPITOR) 20 MG tablet, Take 1 tablet (20 mg total) by mouth at bedtime, Disp: 90 tablet, Rfl: 3   spironolactone (ALDACTONE) 50 MG tablet, Take 1 tablet (50 mg total) by mouth daily., Disp: 90 tablet, Rfl: 3   COVID-19 mRNA bivalent vaccine, Pfizer, (PFIZER COVID-19 VAC BIVALENT) injection, Inject into the muscle., Disp: 0.3 mL, Rfl: 0   Dexlansoprazole 30 MG capsule, Take 1 capsule (30 mg total) by mouth once daily. Generic authorized on 09/27/20., Disp: 90 capsule, Rfl: 1   Dulaglutide (TRULICITY) 3 AS/3.4HD SOPN, Inject 0.5 mLs (3 mg total) subcutaneously once a week, Disp: 2 mL, Rfl: 5   furosemide (LASIX) 20 MG tablet, Take 1 tablet (20 mg total) by mouth once daily as needed for Edema, Disp: 90 tablet, Rfl: 3   gabapentin (NEURONTIN) 300 MG capsule, Take 1 capsule (300 mg total) by mouth nightly, Disp: 90 capsule, Rfl: 3   Glucosamine-Chondroitin 250-200 MG TABS, Take 1 tablet by mouth 2 (two)  times daily. , Disp: , Rfl:    glucose blood test strip, test 3 times a day as instructed, Disp: 100 strip, Rfl: 8   ibuprofen (ADVIL) 600 MG tablet, Take 1 tablet 3 times a day by oral route., Disp: 60 tablet, Rfl: 2   levocetirizine (XYZAL) 5 MG tablet, Take by mouth., Disp: , Rfl:    methocarbamol (ROBAXIN) 750 MG tablet, Take 1 tablet (750 mg total) by mouth once daily as needed, Disp: 90 tablet, Rfl: 1   ondansetron (ZOFRAN-ODT) 4 MG disintegrating tablet, Take one tablet po q 12 prn nausea., Disp: 8 tablet, Rfl: 0   pneumococcal 20-Val Conj Vacc (PREVNAR 20) 0.5 ML injection, Inject into the muscle., Disp: 0.5 mL, Rfl: 0   propranolol ER (INDERAL LA) 60 MG 24 hr capsule, Take 1 capsule (60 mg total) by mouth once daily for 180 days, Disp: 90 capsule,  Rfl: 3   sodium fluoride (FLUORISHIELD) 1.1 % GEL dental gel, Apply topically., Disp: , Rfl:    zolpidem (AMBIEN) 5 MG tablet, TAKE 1 TABLET BY MOUTH NIGHTLY AS NEEDED FOR SLEEP, Disp: 30 tablet, Rfl: 3   Family History  Problem Relation Age of Onset   Diabetes Father    Diabetes Sister    Diabetes Paternal Grandmother    Heart disease Mother    Hypertension Mother    Hyperlipidemia Mother    Bipolar disorder Daughter    Breast cancer Neg Hx      Social History   Tobacco Use   Smoking status: Former    Packs/day: 1.00    Years: 30.00    Pack years: 30.00    Types: Cigarettes    Quit date: 10/24/2020    Years since quitting: 0.6   Smokeless tobacco: Never   Tobacco comments:    has quit in the past multiple times  Vaping Use   Vaping Use: Never used  Substance Use Topics   Alcohol use: No    Alcohol/week: 0.0 standard drinks   Drug use: No    Allergies as of 06/28/2021 - Review Complete 06/28/2021  Allergen Reaction Noted   Codeine Hives 02/07/2015   Other Rash, Other (See Comments), and Itching 02/07/2015   Pantoprazole Rash and Other (See Comments) 06/28/2021   Paroxetine Nausea And Vomiting 06/28/2021    Promethazine Other (See Comments) 02/07/2015   Amlodipine Other (See Comments) 11/07/2015   Deconsal ii [phenylephrine-guaifenesin]  02/07/2015   Pantoprazole sodium Nausea And Vomiting 02/07/2015   Paxil  [paroxetine hcl] Nausea And Vomiting 02/07/2015   Pseudoephedrine Other (See Comments) 02/07/2015   Omeprazole Rash 02/07/2015   Povidone iodine Rash 02/07/2015    Review of Systems:    All systems reviewed and negative except where noted in HPI.   Physical Exam:  BP 130/71 (BP Location: Left Arm, Patient Position: Sitting, Cuff Size: Large)   Pulse 88   Temp 98.1 F (36.7 C) (Temporal)   Ht 5' 2"  (1.575 m)   Wt 253 lb (114.8 kg)   BMI 46.27 kg/m  No LMP recorded. Patient is postmenopausal.  General:   Alert,  Well-developed, well-nourished, pleasant and cooperative in NAD Head:  Normocephalic and atraumatic. Eyes:  Sclera clear, no icterus.   Conjunctiva pink. Ears:  Normal auditory acuity. Nose:  No deformity, discharge, or lesions. Mouth:  No deformity or lesions,oropharynx pink & moist. Neck:  Supple; no masses or thyromegaly. Lungs:  Respirations even and unlabored.  Clear throughout to auscultation.   No wheezes, crackles, or rhonchi. No acute distress. Heart:  Regular rate and rhythm; no murmurs, clicks, rubs, or gallops. Abdomen:  Normal bowel sounds. Soft, obese, non-tender and non-distended without masses, hepatosplenomegaly or hernias noted.  No guarding or rebound tenderness.   Rectal: Not performed Msk:  Symmetrical without gross deformities. Good, equal movement & strength bilaterally. Pulses:  Normal pulses noted. Extremities:  No clubbing, trace edema.  No cyanosis. Neurologic:  Alert and oriented x3;  grossly normal neurologically. Skin:  Intact without significant lesions or rashes. No jaundice. Psych:  Alert and cooperative. Normal mood and affect.  Imaging Studies: Reviewed  Assessment and Plan:   SHYLIN KEIZER is a 65 y.o. pleasant Caucasian  female with metabolic syndrome, compensated NASH cirrhosis  Compensated cirrhosis, child class A, low meld sodium secondary liver disease work-up is unremarkable Erythrocytosis has resolved No clinical or serologic evidence of portal hypertension, no evidence of thrombocytopenia No  evidence of ascites, does have swelling of legs, continue Lasix 20 mg daily and start spironolactone 50 mg daily Follow strict low-sodium diet No evidence of hepatic encephalopathy HCC screening: No evidence of liver lesions, schedule right upper quadrant ultrasound Varices/portal hypertensive gastropathy: Not present based on EGD from 01/08/2021 Strongly reiterated regarding the significance of healthy lifestyle with weight loss and exercise in order to prevent progression of cirrhosis and patient expressed understanding  Type 2 diabetes Patient is started on Trulicity Discussed about tight control of diabetes and management of [place] cirrhosis  History of colon polyps Recommend surveillance colonoscopy in 2024  History of osteopenia Vitamin D levels are normal  Follow up in 6 months   Patricia Darby, MD

## 2021-06-28 NOTE — Telephone Encounter (Signed)
Have ruq ultrasound scheduled for 07/04/2021 at outpatient center a 8:00 appointment with a 7:45 arrival time called patient she is happy with appointment and the location

## 2021-07-04 ENCOUNTER — Ambulatory Visit: Payer: 59

## 2021-07-05 ENCOUNTER — Other Ambulatory Visit: Payer: Self-pay

## 2021-07-05 DIAGNOSIS — R051 Acute cough: Secondary | ICD-10-CM | POA: Diagnosis not present

## 2021-07-05 DIAGNOSIS — Z209 Contact with and (suspected) exposure to unspecified communicable disease: Secondary | ICD-10-CM | POA: Diagnosis not present

## 2021-07-05 DIAGNOSIS — R509 Fever, unspecified: Secondary | ICD-10-CM | POA: Diagnosis not present

## 2021-07-05 DIAGNOSIS — R059 Cough, unspecified: Secondary | ICD-10-CM | POA: Diagnosis not present

## 2021-07-05 DIAGNOSIS — Z03818 Encounter for observation for suspected exposure to other biological agents ruled out: Secondary | ICD-10-CM | POA: Diagnosis not present

## 2021-07-05 DIAGNOSIS — J209 Acute bronchitis, unspecified: Secondary | ICD-10-CM | POA: Diagnosis not present

## 2021-07-05 DIAGNOSIS — J101 Influenza due to other identified influenza virus with other respiratory manifestations: Secondary | ICD-10-CM | POA: Diagnosis not present

## 2021-07-05 DIAGNOSIS — J019 Acute sinusitis, unspecified: Secondary | ICD-10-CM | POA: Diagnosis not present

## 2021-07-05 MED ORDER — PREDNISONE 20 MG PO TABS
ORAL_TABLET | ORAL | 0 refills | Status: DC
  Filled 2021-07-05: qty 10, 5d supply, fill #0

## 2021-07-05 MED ORDER — BENZONATATE 200 MG PO CAPS
ORAL_CAPSULE | ORAL | 0 refills | Status: DC
  Filled 2021-07-05: qty 20, 7d supply, fill #0

## 2021-07-05 MED ORDER — AZITHROMYCIN 250 MG PO TABS
ORAL_TABLET | ORAL | 0 refills | Status: DC
  Filled 2021-07-05: qty 6, 5d supply, fill #0

## 2021-07-05 MED ORDER — OSELTAMIVIR PHOSPHATE 75 MG PO CAPS
ORAL_CAPSULE | ORAL | 0 refills | Status: DC
  Filled 2021-07-05: qty 10, 5d supply, fill #0

## 2021-07-10 ENCOUNTER — Ambulatory Visit: Admission: RE | Admit: 2021-07-10 | Payer: 59 | Source: Ambulatory Visit

## 2021-07-17 ENCOUNTER — Other Ambulatory Visit: Payer: Self-pay

## 2021-07-18 ENCOUNTER — Other Ambulatory Visit: Payer: Self-pay

## 2021-07-18 ENCOUNTER — Ambulatory Visit
Admission: RE | Admit: 2021-07-18 | Discharge: 2021-07-18 | Disposition: A | Discharge status: Home / Self Care | Payer: 59 | Source: Ambulatory Visit | Attending: Gastroenterology | Admitting: Gastroenterology

## 2021-07-18 DIAGNOSIS — K7689 Other specified diseases of liver: Secondary | ICD-10-CM | POA: Diagnosis not present

## 2021-07-18 DIAGNOSIS — K7460 Unspecified cirrhosis of liver: Secondary | ICD-10-CM | POA: Diagnosis not present

## 2021-07-18 DIAGNOSIS — K7581 Nonalcoholic steatohepatitis (NASH): Secondary | ICD-10-CM | POA: Diagnosis not present

## 2021-07-30 ENCOUNTER — Other Ambulatory Visit: Payer: Self-pay

## 2021-07-30 DIAGNOSIS — G4733 Obstructive sleep apnea (adult) (pediatric): Secondary | ICD-10-CM | POA: Diagnosis not present

## 2021-07-31 ENCOUNTER — Other Ambulatory Visit: Payer: Self-pay

## 2021-08-01 ENCOUNTER — Other Ambulatory Visit: Payer: Self-pay

## 2021-08-02 ENCOUNTER — Other Ambulatory Visit: Payer: Self-pay

## 2021-08-02 MED ORDER — METFORMIN HCL 500 MG PO TABS
500.0000 mg | ORAL_TABLET | Freq: Every day | ORAL | 3 refills | Status: DC
  Filled 2021-08-02: qty 30, 30d supply, fill #0
  Filled 2021-08-28: qty 90, 90d supply, fill #1

## 2021-08-06 ENCOUNTER — Other Ambulatory Visit: Payer: Self-pay

## 2021-08-07 ENCOUNTER — Other Ambulatory Visit: Payer: Self-pay

## 2021-08-16 ENCOUNTER — Other Ambulatory Visit: Payer: Self-pay

## 2021-08-16 MED ORDER — NYSTATIN 100000 UNIT/GM EX POWD
Freq: Two times a day (BID) | CUTANEOUS | 0 refills | Status: DC
  Filled 2021-08-16: qty 60, 15d supply, fill #0

## 2021-08-28 ENCOUNTER — Other Ambulatory Visit: Payer: Self-pay

## 2021-09-01 ENCOUNTER — Other Ambulatory Visit (HOSPITAL_COMMUNITY): Payer: Self-pay

## 2021-09-02 ENCOUNTER — Other Ambulatory Visit: Payer: Self-pay

## 2021-09-02 ENCOUNTER — Other Ambulatory Visit (HOSPITAL_COMMUNITY): Payer: Self-pay

## 2021-09-02 MED ORDER — DEXLANSOPRAZOLE 30 MG PO CPDR
DELAYED_RELEASE_CAPSULE | ORAL | 0 refills | Status: DC
  Filled 2021-09-02: qty 30, 30d supply, fill #0
  Filled 2021-09-02: qty 90, 90d supply, fill #0

## 2021-09-10 ENCOUNTER — Other Ambulatory Visit (HOSPITAL_COMMUNITY): Payer: Self-pay

## 2021-09-11 DIAGNOSIS — E119 Type 2 diabetes mellitus without complications: Secondary | ICD-10-CM | POA: Diagnosis not present

## 2021-09-11 DIAGNOSIS — H5213 Myopia, bilateral: Secondary | ICD-10-CM | POA: Diagnosis not present

## 2021-09-16 ENCOUNTER — Other Ambulatory Visit: Payer: Self-pay

## 2021-09-16 DIAGNOSIS — Z872 Personal history of diseases of the skin and subcutaneous tissue: Secondary | ICD-10-CM | POA: Diagnosis not present

## 2021-09-16 DIAGNOSIS — B372 Candidiasis of skin and nail: Secondary | ICD-10-CM | POA: Diagnosis not present

## 2021-09-16 DIAGNOSIS — L821 Other seborrheic keratosis: Secondary | ICD-10-CM | POA: Diagnosis not present

## 2021-09-16 DIAGNOSIS — L570 Actinic keratosis: Secondary | ICD-10-CM | POA: Diagnosis not present

## 2021-09-16 DIAGNOSIS — Z859 Personal history of malignant neoplasm, unspecified: Secondary | ICD-10-CM | POA: Diagnosis not present

## 2021-09-16 DIAGNOSIS — L814 Other melanin hyperpigmentation: Secondary | ICD-10-CM | POA: Diagnosis not present

## 2021-09-16 MED ORDER — KETOCONAZOLE 2 % EX CREA
TOPICAL_CREAM | CUTANEOUS | 2 refills | Status: DC
  Filled 2021-09-16: qty 60, 14d supply, fill #0
  Filled 2021-09-24: qty 60, 15d supply, fill #0

## 2021-09-24 ENCOUNTER — Other Ambulatory Visit: Payer: Self-pay

## 2021-09-24 ENCOUNTER — Other Ambulatory Visit (HOSPITAL_COMMUNITY): Payer: Self-pay

## 2021-10-28 DIAGNOSIS — G4733 Obstructive sleep apnea (adult) (pediatric): Secondary | ICD-10-CM | POA: Diagnosis not present

## 2021-11-13 ENCOUNTER — Other Ambulatory Visit (HOSPITAL_COMMUNITY): Payer: Self-pay

## 2021-11-14 ENCOUNTER — Encounter: Payer: Self-pay | Admitting: Gastroenterology

## 2021-11-14 ENCOUNTER — Other Ambulatory Visit (HOSPITAL_COMMUNITY): Payer: Self-pay

## 2021-11-14 ENCOUNTER — Telehealth: Payer: Self-pay | Admitting: Gastroenterology

## 2021-11-14 MED ORDER — ZOLPIDEM TARTRATE 5 MG PO TABS
5.0000 mg | ORAL_TABLET | Freq: Every evening | ORAL | 3 refills | Status: DC | PRN
  Filled 2021-11-14: qty 30, 30d supply, fill #0
  Filled 2021-12-29: qty 30, 30d supply, fill #1
  Filled 2022-02-13: qty 30, 30d supply, fill #2

## 2021-11-14 MED ORDER — METFORMIN HCL 500 MG PO TABS
500.0000 mg | ORAL_TABLET | Freq: Every day | ORAL | 3 refills | Status: DC
  Filled 2021-11-14 – 2021-12-01 (×2): qty 30, 30d supply, fill #0

## 2021-11-14 MED ORDER — DEXLANSOPRAZOLE 30 MG PO CPDR
DELAYED_RELEASE_CAPSULE | ORAL | 1 refills | Status: DC
  Filled 2021-11-14: qty 90, fill #0

## 2021-11-14 NOTE — Telephone Encounter (Signed)
Called patient and said I was sorry to patient and said that Dr. Marius Ditch no longer do Friday Clinic. She sees patient Monday through Thursday in the afternoon. Patient states she only wants to go to [facility]. Informed patient that my next available in [place] would be 02/11/22 in [place] she states she can not wait that long. Made her appointment in [place] on 12/17/21 ?

## 2021-11-14 NOTE — Telephone Encounter (Signed)
Patient left vm saying that she was seen by Dr Marius Ditch on 06/28/2021 and she was supposed to have a 6 month follow up scheduled. I was trying to get her appt scheduled and work with her. I was going to try to fit her in on 01/02/2022 and she said that she could not do that day and stated that she needs a Friday. I proceeded to let her know that we do not have clinic on Fridays. I informed patient that the next available days would be the end of June on 02/06/2022 or 02/11/2022. Patient then made comment saying that "if she wasn't that important then she would just go back to [facility]." ?

## 2021-11-14 NOTE — Telephone Encounter (Signed)
Called patient and said I was sorry to patient and said that Dr. Marius Ditch no longer do Friday Clinic. She sees patient Monday through Thursday in the afternoon. Patient states she only wants to go to [facility]. Informed patient that my next available in [place] would be 02/11/22 in [place] she states she can not wait that long. Made her appointment in [place] on 12/17/21 ?

## 2021-11-15 ENCOUNTER — Other Ambulatory Visit (HOSPITAL_COMMUNITY): Payer: Self-pay

## 2021-11-17 ENCOUNTER — Other Ambulatory Visit (HOSPITAL_COMMUNITY): Payer: Self-pay

## 2021-11-17 MED ORDER — DULAGLUTIDE 3 MG/0.5ML ~~LOC~~ SOAJ
SUBCUTANEOUS | 5 refills | Status: DC
  Filled 2021-11-17: qty 2, 28d supply, fill #0

## 2021-11-18 ENCOUNTER — Other Ambulatory Visit (HOSPITAL_COMMUNITY): Payer: Self-pay

## 2021-12-01 ENCOUNTER — Other Ambulatory Visit (HOSPITAL_COMMUNITY): Payer: Self-pay

## 2021-12-02 ENCOUNTER — Other Ambulatory Visit (HOSPITAL_COMMUNITY): Payer: Self-pay

## 2021-12-04 ENCOUNTER — Other Ambulatory Visit (HOSPITAL_COMMUNITY): Payer: Self-pay

## 2021-12-05 ENCOUNTER — Other Ambulatory Visit (HOSPITAL_COMMUNITY): Payer: Self-pay

## 2021-12-05 MED ORDER — METHOCARBAMOL 750 MG PO TABS
ORAL_TABLET | ORAL | 0 refills | Status: DC
  Filled 2021-12-05: qty 90, 90d supply, fill #0

## 2021-12-13 ENCOUNTER — Other Ambulatory Visit (HOSPITAL_COMMUNITY): Payer: Self-pay

## 2021-12-13 ENCOUNTER — Other Ambulatory Visit: Payer: Self-pay

## 2021-12-13 DIAGNOSIS — E782 Mixed hyperlipidemia: Secondary | ICD-10-CM | POA: Diagnosis not present

## 2021-12-13 DIAGNOSIS — I10 Essential (primary) hypertension: Secondary | ICD-10-CM | POA: Diagnosis not present

## 2021-12-13 DIAGNOSIS — Z122 Encounter for screening for malignant neoplasm of respiratory organs: Secondary | ICD-10-CM

## 2021-12-13 DIAGNOSIS — Z87891 Personal history of nicotine dependence: Secondary | ICD-10-CM

## 2021-12-13 DIAGNOSIS — E119 Type 2 diabetes mellitus without complications: Secondary | ICD-10-CM | POA: Diagnosis not present

## 2021-12-13 MED ORDER — CEFUROXIME AXETIL 500 MG PO TABS
ORAL_TABLET | ORAL | 0 refills | Status: DC
  Filled 2021-12-13: qty 10, 5d supply, fill #0

## 2021-12-16 ENCOUNTER — Other Ambulatory Visit: Payer: Self-pay

## 2021-12-17 ENCOUNTER — Encounter: Payer: Self-pay | Admitting: Gastroenterology

## 2021-12-17 ENCOUNTER — Ambulatory Visit (INDEPENDENT_AMBULATORY_CARE_PROVIDER_SITE_OTHER): Payer: 59 | Admitting: Gastroenterology

## 2021-12-17 VITALS — BP 120/70 | HR 90 | Temp 98.4°F | Ht 62.0 in | Wt 248.0 lb

## 2021-12-17 DIAGNOSIS — K7460 Unspecified cirrhosis of liver: Secondary | ICD-10-CM | POA: Diagnosis not present

## 2021-12-17 DIAGNOSIS — K7581 Nonalcoholic steatohepatitis (NASH): Secondary | ICD-10-CM | POA: Diagnosis not present

## 2021-12-17 NOTE — Progress Notes (Signed)
?  ?Patricia Darby, MD ?[address]  ?[address]  ?[place], [place] [address]  ?Main: [phone]  ?Fax: [phone] ? ? ? ?Gastroenterology Consultation ? ?Referring Provider:     Marinda Elk, MD ?Primary Care Physician:  Patricia Elk, MD ?Primary Gastroenterologist:  Dr. Cephas [name] ?Reason for Consultation:     Patricia [name] cirrhosis ?      ? HPI:   ?Patricia [name] is a 66 y.o. female referred by Dr. Carrie [name], Wilhemena Durie, MD  for consultation & management of Patricia [name] cirrhosis.  Patient is diagnosed with fatty liver, biopsy-proven, at [facility] in 07/2017.  History of metabolic syndrome, spinal stenosis.  Patient was in clinical trials at [facility] in 2019 for fatty liver.  She was enrolled to try obeticholic acid as well as other medication and the trial ended due to lack of benefit.  Patient did develop elevated liver enzymes as part of second clinical trial which resulted in elevated alkaline phosphatase and pruritus in 2019.  Patient is an Therapist, sports, works at [employer], currently working remotely as a Copy.  She managed to lose about 10 to 15 pounds before the holidays.  She regained 10 pounds during holidays.  She does acknowledge that she manages not to eat all day but when she sees the food, she loses control.  She does smoke cigarettes.  She does not drink alcohol.  She denies any swelling of legs, abdominal pain, nausea or vomiting.  She is on Dexilant 30 mg daily for chronic GERD.  She had history of cholecystectomy and appendectomy. ? ?Follow-up visit 12/14/2020 ?Patricia [name] is here for follow-up of her cirrhosis.  She quit smoking on March 9.  And, she has gained about 20 pounds since then.  She does report episodes of constipation and diarrhea.  She loves baking.  Attributes most of her weight gain to it.  She is following low-sodium diet.  Her most recent LFTs revealed mildly elevated.  She does have history of osteopenia. ? ?Follow-up visit 06/28/2021 ?Patient is here for follow-up of  cirrhosis.  Patient reports mild swelling in her ankles, she is currently on Lasix 20 mg daily.  Her hemoglobin A1c went up to 7.1.  Her LFTs are mildly elevated.  She underwent secondary liver disease work-up which was unremarkable.  She is adherent to low-sodium diet ? ?Follow-up visit 5-23 ?Patient is here for follow-up of cirrhosis.  Patient had an ultrasound in December 2022 which did not reveal any features to suggest cirrhosis other than fatty liver only.  There was no evidence of portal hypertension.  Patient lives alone, has lost weight and then regained weight during winter months.  She does have lemonade which is 0 sugar, she does have coffee with artificial sweetener daily.  She does consume red meat regularly. ? ?NSAIDs: None ? ?Antiplts/Anticoagulants/Anti thrombotics: None ? ?GI Procedures:  ?EGD 09/10/2020 ?- Normal duodenal bulb and second portion of the duodenum. ?- Normal stomach. ?- Esophagogastric landmarks identified. ?- Ectopic gastric mucosa in the upper third of the esophagus. ?- Normal gastroesophageal junction. ?- No specimens collected. ? ? ?Colonoscopy by Dr. Vira [name] in 05/07/2018 ?DIAGNOSIS:  ?A. COLON POLYP, HEPATIC FLEXURE; HOT SNARE:  ?- HYPERPLASTIC POLYP.  ?- NEGATIVE FOR DYSPLASIA AND MALIGNANCY.  ? ?B.  COLON POLYP, CECUM; HOT SNARE:  ?- TUBULAR ADENOMA.  ?- FECAL DEBRIS.  ?- NEGATIVE FOR HIGH-GRADE DYSPLASIA AND MALIGNANCY.  ? ?C.  COLON POLYP, ASCENDING; HOT SNARE:  ?- SESSILE SERRATED ADENOMA.  ?-  NEGATIVE FOR HIGH-GRADE DYSPLASIA AND MALIGNANCY.  ? ?D.  COLON POLYP X2, SIGMOID; COLD BIOPSY:  ?- HYPERPLASTIC POLYP (3).  ?- NEGATIVE FOR DYSPLASIA AND MALIGNANCY. ? ?Upper endoscopy 11/03/2014 ?DIAGNOSIS:  ?A.  STOMACH, ANTRUM; MUCOSAL BIOPSIES:  ?- REACTIVE GASTROPATHY.  ?- NO HELICOBACTER PYLORI ORGANISMS OR DYSPLASIA SEEN.  ?- SEE COMMENT.  ? ?B.  STOMACH, BODY; MUCOSAL BIOPSIES:  ?- SUPERFICIAL CHRONIC GASTRITIS, MILD AND INACTIVE.  ?- NO HELICOBACTER PYLORI ORGANISMS OR  DYSPLASIA SEEN.  ? ?Colonoscopy 04/06/2013 ?Diagnosis:  ?Part A: DISTAL SIGMOID COLON POLYP COLD BIOPSY:  ?- HYPERPLASTIC POLYP.  ?- NEGATIVE FOR DYSPLASIA AND MALIGNANCY.  ?.  ?Part B: PROXIMAL SIGMOID COLON POLYP COLD BIOPSY:  ?- HYPERPLASTIC POLYP.  ?- NEGATIVE FOR DYSPLASIA AND MALIGNANCY.  ? ? ?Past Medical History:  ?Diagnosis Date  ? Allergy   ? Cancer [facility])   ? cervical  ? Current smoker 08/22/2016  ? Depression   ? Diabetes mellitus without complication ([place])   ? Gastritis 2016  ? GERD (gastroesophageal reflux disease)   ? Hepatic disease   ? Hyperlipidemia   ? Hypertension   ? NASH (nonalcoholic steatohepatitis)   ? NASH (nonalcoholic steatohepatitis)   ? Sleep apnea   ? Spinal stenosis   ? ? ?Past Surgical History:  ?Procedure Laterality Date  ? APPENDECTOMY    ? BREAST BIOPSY Right 2006  ? bx/clip-neg  ? CARPAL TUNNEL RELEASE Right   ? CERVICAL DISCECTOMY    ? CHOLECYSTECTOMY    ? COLONOSCOPY WITH PROPOFOL N/A 05/07/2018  ? Procedure: COLONOSCOPY WITH PROPOFOL;  Surgeon: Patricia Silvas, MD;  Location[facility] ENDOSCOPY;  Service: Endoscopy;  Laterality: N/A;  ? ESOPHAGOGASTRODUODENOSCOPY (EGD) WITH PROPOFOL N/A 09/10/2020  ? Procedure: ESOPHAGOGASTRODUODENOSCOPY (EGD) WITH PROPOFOL;  Surgeon: Patricia Landsman, MD;  Location: [facility] ENDOSCOPY;  Service: Gastroenterology;  Laterality: N/A;  ? KNEE ARTHROSCOPY Bilateral   ? LIVER BIOPSY    ? ROTATOR CUFF REPAIR Right   ? TONSILLECTOMY    ? uterine ablation    ? ? ?Current Outpatient Medications:  ?  atorvastatin (LIPITOR) 20 MG tablet, Take 1 tablet (20 mg total) by mouth at bedtime, Disp: 90 tablet, Rfl: 3 ?  cefUROXime (CEFTIN) 500 MG tablet, Take 1 tablet (500 mg total) by mouth 2 (two) times daily for 5 days, Disp: 10 tablet, Rfl: 0 ?  Dexlansoprazole 30 MG capsule DR, Take 1 capsule (30 mg total) by mouth once daily, Disp: 90 capsule, Rfl: 0 ?  Dulaglutide (TRULICITY) 3 OE/3.2ZY SOPN, Inject 0.5 mLs (3 mg total) under the skin once a week, Disp: 2 mL,  Rfl: 5 ?  fluticasone (FLONASE) 50 MCG/ACT nasal spray, , Disp: , Rfl:  ?  furosemide (LASIX) 20 MG tablet, Take 1 tablet (20 mg total) by mouth once daily as needed for edema, Disp: 90 tablet, Rfl: 3 ?  gabapentin (NEURONTIN) 600 MG tablet, , Disp: , Rfl:  ?  Glucosamine-Chondroitin 250-200 MG TABS, Take 1 tablet by mouth 2 (two) times daily. , Disp: , Rfl:  ?  glucose blood test strip, Test 3 times a day as instructed, Disp: 100 strip, Rfl: 8 ?  levocetirizine (XYZAL) 5 MG tablet, Take by mouth., Disp: , Rfl:  ?  metFORMIN (GLUCOPHAGE) 500 MG tablet, Take 1 tablet by mouth daily with breakfast., Disp: , Rfl:  ?  methocarbamol (ROBAXIN) 750 MG tablet, Take by mouth., Disp: , Rfl:  ?  ondansetron (ZOFRAN) 4 MG tablet, 4 mg, Disp: , Rfl:  ?  propranolol ER (INDERAL LA) 60 MG 24 hr capsule, Take 1 capsule (60 mg total) by mouth once daily, Disp: 90 capsule, Rfl: 3 ?  sodium fluoride (FLUORISHIELD) 1.1 % GEL dental gel, Apply topically., Disp: , Rfl:  ?  spironolactone (ALDACTONE) 50 MG tablet, Take 1 tablet (50 mg total) by mouth daily., Disp: 90 tablet, Rfl: 3 ?  zolpidem (AMBIEN) 5 MG tablet, TAKE 1 TABLET BY MOUTH NIGHTLY AS NEEDED FOR SLEEP, Disp: 30 tablet, Rfl: 3 ? ? ?Family History  ?Problem Relation Age of Onset  ? Diabetes Father   ? Diabetes Sister   ? Diabetes Paternal Grandmother   ? Heart disease Mother   ? Hypertension Mother   ? Hyperlipidemia Mother   ? Bipolar disorder Daughter   ? Breast cancer Neg Hx   ?  ? ?Social History  ? ?Tobacco Use  ? Smoking status: Former  ?  Packs/day: 1.00  ?  Years: 30.00  ?  Pack years: 30.00  ?  Types: Cigarettes  ?  Quit date: 10/24/2020  ?  Years since quitting: 1.1  ? Smokeless tobacco: Never  ? Tobacco comments:  ?  has quit in the past multiple times  ?Vaping Use  ? Vaping Use: Never used  ?Substance Use Topics  ? Alcohol use: No  ?  Alcohol/week: 0.0 standard drinks  ? Drug use: No  ? ? ?Allergies as of 12/17/2021 - Review Complete 12/17/2021  ?Allergen Reaction  Noted  ? Codeine Hives 02/07/2015  ? Other Rash, Other (See Comments), and Itching 02/07/2015  ? Pantoprazole Rash and Other (See Comments) 06/28/2021  ? Paroxetine Nausea And Vomiting 06/28/2021  ? Promethazine

## 2021-12-19 ENCOUNTER — Other Ambulatory Visit: Payer: Self-pay | Admitting: Physician Assistant

## 2021-12-19 ENCOUNTER — Other Ambulatory Visit: Payer: Self-pay

## 2021-12-19 DIAGNOSIS — Z1231 Encounter for screening mammogram for malignant neoplasm of breast: Secondary | ICD-10-CM

## 2021-12-19 DIAGNOSIS — Z0000 Encounter for general adult medical examination without abnormal findings: Secondary | ICD-10-CM | POA: Diagnosis not present

## 2021-12-19 DIAGNOSIS — G4733 Obstructive sleep apnea (adult) (pediatric): Secondary | ICD-10-CM | POA: Diagnosis not present

## 2021-12-19 DIAGNOSIS — K7460 Unspecified cirrhosis of liver: Secondary | ICD-10-CM | POA: Diagnosis not present

## 2021-12-19 DIAGNOSIS — F5104 Psychophysiologic insomnia: Secondary | ICD-10-CM | POA: Diagnosis not present

## 2021-12-19 DIAGNOSIS — E782 Mixed hyperlipidemia: Secondary | ICD-10-CM | POA: Diagnosis not present

## 2021-12-19 DIAGNOSIS — I10 Essential (primary) hypertension: Secondary | ICD-10-CM | POA: Diagnosis not present

## 2021-12-19 DIAGNOSIS — Z6841 Body Mass Index (BMI) 40.0 and over, adult: Secondary | ICD-10-CM | POA: Diagnosis not present

## 2021-12-19 DIAGNOSIS — E119 Type 2 diabetes mellitus without complications: Secondary | ICD-10-CM | POA: Diagnosis not present

## 2021-12-19 LAB — NASH FIBROSURE
ALPHA 2-MACROGLOBULINS, QN: 280 mg/dL — ABNORMAL HIGH (ref 110–276)
ALT (SGPT) P5P: 50 IU/L — ABNORMAL HIGH (ref 0–40)
AST (SGOT) P5P: 50 IU/L — ABNORMAL HIGH (ref 0–40)
Apolipoprotein A-1: 135 mg/dL (ref 116–209)
Bilirubin, Total: 0.3 mg/dL (ref 0.0–1.2)
Cholesterol, Total: 171 mg/dL (ref 100–199)
Fibrosis Score: 0.48 — ABNORMAL HIGH (ref 0.00–0.21)
GGT: 121 IU/L — ABNORMAL HIGH (ref 0–60)
Glucose: 155 mg/dL — ABNORMAL HIGH (ref 70–99)
Haptoglobin: 115 mg/dL (ref 37–355)
Height: 62 in
NASH Score: 0.75 — ABNORMAL HIGH
Steatosis Score: 0.88 — ABNORMAL HIGH (ref 0.00–0.30)
Triglycerides: 275 mg/dL — ABNORMAL HIGH (ref 0–149)
Weight: 247 [lb_av]

## 2021-12-19 MED ORDER — OZEMPIC (1 MG/DOSE) 4 MG/3ML ~~LOC~~ SOPN
PEN_INJECTOR | SUBCUTANEOUS | 1 refills | Status: DC
  Filled 2021-12-19: qty 3, 28d supply, fill #0

## 2021-12-25 ENCOUNTER — Ambulatory Visit
Admission: RE | Admit: 2021-12-25 | Discharge: 2021-12-25 | Disposition: A | Discharge status: Home / Self Care | Payer: 59 | Source: Ambulatory Visit | Attending: Acute Care | Admitting: Acute Care

## 2021-12-25 DIAGNOSIS — Z122 Encounter for screening for malignant neoplasm of respiratory organs: Secondary | ICD-10-CM | POA: Diagnosis not present

## 2021-12-25 DIAGNOSIS — Z87891 Personal history of nicotine dependence: Secondary | ICD-10-CM | POA: Insufficient documentation

## 2021-12-27 ENCOUNTER — Encounter: Payer: Self-pay | Admitting: Gastroenterology

## 2021-12-29 ENCOUNTER — Other Ambulatory Visit (HOSPITAL_COMMUNITY): Payer: Self-pay

## 2021-12-30 ENCOUNTER — Other Ambulatory Visit (HOSPITAL_COMMUNITY): Payer: Self-pay

## 2021-12-30 ENCOUNTER — Other Ambulatory Visit: Payer: Self-pay

## 2021-12-30 DIAGNOSIS — Z122 Encounter for screening for malignant neoplasm of respiratory organs: Secondary | ICD-10-CM

## 2021-12-30 DIAGNOSIS — Z87891 Personal history of nicotine dependence: Secondary | ICD-10-CM

## 2021-12-30 MED ORDER — METFORMIN HCL 500 MG PO TABS
500.0000 mg | ORAL_TABLET | Freq: Every day | ORAL | 1 refills | Status: DC
  Filled 2021-12-30: qty 90, 90d supply, fill #0
  Filled 2022-03-14: qty 90, 90d supply, fill #1

## 2022-01-01 ENCOUNTER — Other Ambulatory Visit (HOSPITAL_COMMUNITY): Payer: Self-pay

## 2022-01-01 MED ORDER — OZEMPIC (2 MG/DOSE) 8 MG/3ML ~~LOC~~ SOPN
PEN_INJECTOR | SUBCUTANEOUS | 0 refills | Status: DC
  Filled 2022-01-01: qty 9, 84d supply, fill #0

## 2022-01-02 ENCOUNTER — Other Ambulatory Visit (HOSPITAL_COMMUNITY): Payer: Self-pay

## 2022-01-02 MED ORDER — DEXLANSOPRAZOLE 30 MG PO CPDR
DELAYED_RELEASE_CAPSULE | ORAL | 1 refills | Status: DC
  Filled 2022-01-02: qty 90, 90d supply, fill #0

## 2022-01-03 ENCOUNTER — Other Ambulatory Visit (HOSPITAL_COMMUNITY): Payer: Self-pay

## 2022-01-06 DIAGNOSIS — K7460 Unspecified cirrhosis of liver: Secondary | ICD-10-CM | POA: Diagnosis not present

## 2022-01-06 DIAGNOSIS — K7581 Nonalcoholic steatohepatitis (NASH): Secondary | ICD-10-CM | POA: Diagnosis not present

## 2022-01-07 ENCOUNTER — Other Ambulatory Visit: Payer: Self-pay | Admitting: Physician Assistant

## 2022-01-07 ENCOUNTER — Ambulatory Visit
Admission: RE | Admit: 2022-01-07 | Discharge: 2022-01-07 | Disposition: A | Discharge status: Home / Self Care | Payer: 59 | Source: Ambulatory Visit | Attending: Physician Assistant | Admitting: Physician Assistant

## 2022-01-07 DIAGNOSIS — K7460 Unspecified cirrhosis of liver: Secondary | ICD-10-CM

## 2022-01-07 DIAGNOSIS — Z1231 Encounter for screening mammogram for malignant neoplasm of breast: Secondary | ICD-10-CM | POA: Insufficient documentation

## 2022-01-10 ENCOUNTER — Other Ambulatory Visit (HOSPITAL_COMMUNITY): Payer: Self-pay

## 2022-01-20 ENCOUNTER — Ambulatory Visit
Admission: RE | Admit: 2022-01-20 | Discharge: 2022-01-20 | Disposition: A | Discharge status: Home / Self Care | Payer: 59 | Source: Ambulatory Visit | Attending: Physician Assistant | Admitting: Physician Assistant

## 2022-01-20 DIAGNOSIS — K7460 Unspecified cirrhosis of liver: Secondary | ICD-10-CM | POA: Diagnosis not present

## 2022-01-20 DIAGNOSIS — Z9049 Acquired absence of other specified parts of digestive tract: Secondary | ICD-10-CM | POA: Diagnosis not present

## 2022-01-26 DIAGNOSIS — G4733 Obstructive sleep apnea (adult) (pediatric): Secondary | ICD-10-CM | POA: Diagnosis not present

## 2022-01-28 ENCOUNTER — Other Ambulatory Visit: Payer: Self-pay

## 2022-01-28 MED ORDER — NEOMYCIN-POLYMYXIN-DEXAMETH 3.5-10000-0.1 OP SUSP
OPHTHALMIC | 0 refills | Status: DC
  Filled 2022-01-28: qty 5, 12d supply, fill #0

## 2022-02-11 ENCOUNTER — Other Ambulatory Visit (HOSPITAL_COMMUNITY): Payer: Self-pay

## 2022-02-13 ENCOUNTER — Other Ambulatory Visit (HOSPITAL_COMMUNITY): Payer: Self-pay

## 2022-02-14 ENCOUNTER — Other Ambulatory Visit (HOSPITAL_COMMUNITY): Payer: Self-pay

## 2022-02-14 MED ORDER — PRECISION QID TEST VI STRP
ORAL_STRIP | 8 refills | Status: DC
  Filled 2022-02-14: qty 100, 30d supply, fill #0
  Filled 2022-03-14: qty 100, 30d supply, fill #1
  Filled 2022-04-13: qty 100, 30d supply, fill #2
  Filled 2022-06-04: qty 100, 30d supply, fill #3
  Filled 2022-06-29: qty 100, 34d supply, fill #4
  Filled 2022-08-03 – 2022-11-12 (×4): qty 100, 34d supply, fill #5
  Filled 2022-11-13: qty 100, 34d supply, fill #0
  Filled 2022-12-13: qty 100, 34d supply, fill #1
  Filled 2023-01-20: qty 100, 34d supply, fill #2

## 2022-02-16 ENCOUNTER — Other Ambulatory Visit (HOSPITAL_COMMUNITY): Payer: Self-pay

## 2022-02-17 ENCOUNTER — Other Ambulatory Visit (HOSPITAL_COMMUNITY): Payer: Self-pay

## 2022-02-17 MED ORDER — PROPRANOLOL HCL ER 60 MG PO CP24
ORAL_CAPSULE | ORAL | 1 refills | Status: DC
Start: 2022-02-17 — End: 2022-08-19
  Filled 2022-02-17: qty 90, 90d supply, fill #0
  Filled 2022-05-11: qty 90, 90d supply, fill #1

## 2022-02-17 MED ORDER — GABAPENTIN 300 MG PO CAPS
ORAL_CAPSULE | ORAL | 1 refills | Status: DC
  Filled 2022-02-17: qty 90, 90d supply, fill #0
  Filled 2022-05-11: qty 90, 90d supply, fill #1

## 2022-02-17 MED ORDER — FUROSEMIDE 20 MG PO TABS
ORAL_TABLET | ORAL | 1 refills | Status: DC
  Filled 2022-02-17: qty 90, 90d supply, fill #0
  Filled 2022-05-11: qty 90, 90d supply, fill #1

## 2022-02-25 ENCOUNTER — Other Ambulatory Visit (HOSPITAL_COMMUNITY): Payer: Self-pay

## 2022-02-27 ENCOUNTER — Other Ambulatory Visit (HOSPITAL_COMMUNITY): Payer: Self-pay

## 2022-03-11 DIAGNOSIS — H43313 Vitreous membranes and strands, bilateral: Secondary | ICD-10-CM | POA: Diagnosis not present

## 2022-03-11 DIAGNOSIS — E119 Type 2 diabetes mellitus without complications: Secondary | ICD-10-CM | POA: Diagnosis not present

## 2022-03-14 ENCOUNTER — Other Ambulatory Visit (HOSPITAL_COMMUNITY): Payer: Self-pay

## 2022-03-17 ENCOUNTER — Other Ambulatory Visit: Payer: Self-pay

## 2022-03-17 ENCOUNTER — Other Ambulatory Visit (HOSPITAL_COMMUNITY): Payer: Self-pay

## 2022-03-17 DIAGNOSIS — E119 Type 2 diabetes mellitus without complications: Secondary | ICD-10-CM | POA: Diagnosis not present

## 2022-03-17 DIAGNOSIS — R519 Headache, unspecified: Secondary | ICD-10-CM | POA: Diagnosis not present

## 2022-03-17 DIAGNOSIS — J029 Acute pharyngitis, unspecified: Secondary | ICD-10-CM | POA: Diagnosis not present

## 2022-03-17 MED ORDER — CEFDINIR 300 MG PO CAPS
ORAL_CAPSULE | ORAL | 0 refills | Status: DC
  Filled 2022-03-17: qty 20, 10d supply, fill #0

## 2022-03-17 MED ORDER — NAPROXEN 500 MG PO TABS
ORAL_TABLET | ORAL | 1 refills | Status: DC
  Filled 2022-03-17: qty 20, 10d supply, fill #0
  Filled 2022-04-13: qty 20, 10d supply, fill #1

## 2022-03-17 MED ORDER — OZEMPIC (2 MG/DOSE) 8 MG/3ML ~~LOC~~ SOPN
PEN_INJECTOR | SUBCUTANEOUS | 0 refills | Status: DC
  Filled 2022-03-17: qty 3, 28d supply, fill #0
  Filled 2022-05-11: qty 3, 28d supply, fill #1
  Filled 2022-06-04 – 2022-08-05 (×3): qty 3, 28d supply, fill #2

## 2022-03-18 ENCOUNTER — Other Ambulatory Visit (HOSPITAL_COMMUNITY): Payer: Self-pay

## 2022-03-24 ENCOUNTER — Other Ambulatory Visit (HOSPITAL_COMMUNITY): Payer: Self-pay

## 2022-03-24 DIAGNOSIS — E119 Type 2 diabetes mellitus without complications: Secondary | ICD-10-CM | POA: Diagnosis not present

## 2022-03-24 DIAGNOSIS — G4733 Obstructive sleep apnea (adult) (pediatric): Secondary | ICD-10-CM | POA: Diagnosis not present

## 2022-03-24 DIAGNOSIS — K7460 Unspecified cirrhosis of liver: Secondary | ICD-10-CM | POA: Diagnosis not present

## 2022-03-24 DIAGNOSIS — E782 Mixed hyperlipidemia: Secondary | ICD-10-CM | POA: Diagnosis not present

## 2022-03-24 DIAGNOSIS — F5104 Psychophysiologic insomnia: Secondary | ICD-10-CM | POA: Diagnosis not present

## 2022-03-24 DIAGNOSIS — Z6841 Body Mass Index (BMI) 40.0 and over, adult: Secondary | ICD-10-CM | POA: Diagnosis not present

## 2022-03-24 DIAGNOSIS — Z9989 Dependence on other enabling machines and devices: Secondary | ICD-10-CM | POA: Diagnosis not present

## 2022-03-24 DIAGNOSIS — I10 Essential (primary) hypertension: Secondary | ICD-10-CM | POA: Diagnosis not present

## 2022-03-24 MED ORDER — OZEMPIC (2 MG/DOSE) 8 MG/3ML ~~LOC~~ SOPN
PEN_INJECTOR | SUBCUTANEOUS | 1 refills | Status: DC
  Filled 2022-04-13: qty 3, 28d supply, fill #0
  Filled 2022-06-13: qty 3, 28d supply, fill #1
  Filled 2022-07-14: qty 3, 28d supply, fill #0
  Filled 2022-08-15 – 2022-09-22 (×4): qty 3, 28d supply, fill #1
  Filled 2022-10-14: qty 3, 28d supply, fill #2
  Filled 2022-11-12 – 2022-11-13 (×2): qty 3, 28d supply, fill #3

## 2022-03-24 MED ORDER — ATORVASTATIN CALCIUM 20 MG PO TABS
20.0000 mg | ORAL_TABLET | Freq: Every day | ORAL | 1 refills | Status: DC
  Filled 2022-03-24 – 2022-05-24 (×3): qty 90, 90d supply, fill #0
  Filled 2022-08-15 – 2022-08-29 (×2): qty 90, 90d supply, fill #1

## 2022-03-24 MED ORDER — METFORMIN HCL 500 MG PO TABS
500.0000 mg | ORAL_TABLET | Freq: Every day | ORAL | 1 refills | Status: DC
  Filled 2022-04-13 – 2022-06-02 (×3): qty 90, 90d supply, fill #0
  Filled 2022-09-03: qty 90, 90d supply, fill #1

## 2022-03-24 MED ORDER — SPIRONOLACTONE 50 MG PO TABS
50.0000 mg | ORAL_TABLET | Freq: Every day | ORAL | 1 refills | Status: DC
  Filled 2022-04-13 – 2022-06-02 (×2): qty 90, 90d supply, fill #0
  Filled 2022-08-27: qty 90, 90d supply, fill #1

## 2022-04-14 ENCOUNTER — Other Ambulatory Visit (HOSPITAL_COMMUNITY): Payer: Self-pay

## 2022-04-27 DIAGNOSIS — G4733 Obstructive sleep apnea (adult) (pediatric): Secondary | ICD-10-CM | POA: Diagnosis not present

## 2022-04-30 ENCOUNTER — Other Ambulatory Visit: Payer: Self-pay

## 2022-04-30 MED ORDER — PAXLOVID (300/100) 20 X 150 MG & 10 X 100MG PO TBPK
ORAL_TABLET | ORAL | 0 refills | Status: DC
  Filled 2022-04-30: qty 30, 5d supply, fill #0

## 2022-05-12 ENCOUNTER — Other Ambulatory Visit (HOSPITAL_COMMUNITY): Payer: Self-pay

## 2022-05-13 ENCOUNTER — Other Ambulatory Visit: Payer: Self-pay

## 2022-05-13 DIAGNOSIS — R42 Dizziness and giddiness: Secondary | ICD-10-CM | POA: Diagnosis not present

## 2022-05-13 DIAGNOSIS — Z8616 Personal history of COVID-19: Secondary | ICD-10-CM | POA: Diagnosis not present

## 2022-05-13 DIAGNOSIS — R059 Cough, unspecified: Secondary | ICD-10-CM | POA: Diagnosis not present

## 2022-05-13 DIAGNOSIS — J209 Acute bronchitis, unspecified: Secondary | ICD-10-CM | POA: Diagnosis not present

## 2022-05-13 MED ORDER — PREDNISONE 20 MG PO TABS
ORAL_TABLET | ORAL | 0 refills | Status: DC
  Filled 2022-05-13: qty 12, 9d supply, fill #0

## 2022-05-13 MED ORDER — DOXYCYCLINE HYCLATE 100 MG PO CAPS
ORAL_CAPSULE | ORAL | 0 refills | Status: DC
  Filled 2022-05-13: qty 20, 10d supply, fill #0

## 2022-05-24 ENCOUNTER — Other Ambulatory Visit (HOSPITAL_COMMUNITY): Payer: Self-pay

## 2022-06-02 ENCOUNTER — Other Ambulatory Visit (HOSPITAL_COMMUNITY): Payer: Self-pay

## 2022-06-03 ENCOUNTER — Other Ambulatory Visit (HOSPITAL_COMMUNITY): Payer: Self-pay

## 2022-06-03 MED ORDER — ZOLPIDEM TARTRATE 5 MG PO TABS
ORAL_TABLET | ORAL | 3 refills | Status: DC
  Filled 2022-06-03: qty 30, 30d supply, fill #0
  Filled 2022-06-29 – 2022-07-01 (×2): qty 30, 30d supply, fill #1
  Filled 2022-08-03 – 2022-08-29 (×3): qty 30, 30d supply, fill #2
  Filled 2022-09-24 – 2022-10-14 (×2): qty 30, 30d supply, fill #3

## 2022-06-04 ENCOUNTER — Other Ambulatory Visit (HOSPITAL_COMMUNITY): Payer: Self-pay

## 2022-06-13 ENCOUNTER — Other Ambulatory Visit (HOSPITAL_COMMUNITY): Payer: Self-pay

## 2022-06-16 ENCOUNTER — Other Ambulatory Visit (HOSPITAL_COMMUNITY): Payer: Self-pay

## 2022-06-18 ENCOUNTER — Other Ambulatory Visit (HOSPITAL_COMMUNITY): Payer: Self-pay

## 2022-06-24 ENCOUNTER — Ambulatory Visit: Payer: 59 | Admitting: Gastroenterology

## 2022-06-30 ENCOUNTER — Other Ambulatory Visit (HOSPITAL_COMMUNITY): Payer: Self-pay

## 2022-07-01 ENCOUNTER — Other Ambulatory Visit (HOSPITAL_COMMUNITY): Payer: Self-pay

## 2022-07-14 ENCOUNTER — Other Ambulatory Visit: Payer: Self-pay

## 2022-07-14 ENCOUNTER — Other Ambulatory Visit (HOSPITAL_COMMUNITY): Payer: Self-pay

## 2022-07-21 DIAGNOSIS — E782 Mixed hyperlipidemia: Secondary | ICD-10-CM | POA: Diagnosis not present

## 2022-07-21 DIAGNOSIS — E119 Type 2 diabetes mellitus without complications: Secondary | ICD-10-CM | POA: Diagnosis not present

## 2022-07-21 DIAGNOSIS — I10 Essential (primary) hypertension: Secondary | ICD-10-CM | POA: Diagnosis not present

## 2022-07-27 DIAGNOSIS — G4733 Obstructive sleep apnea (adult) (pediatric): Secondary | ICD-10-CM | POA: Diagnosis not present

## 2022-07-28 ENCOUNTER — Other Ambulatory Visit: Payer: Self-pay

## 2022-07-28 DIAGNOSIS — G4733 Obstructive sleep apnea (adult) (pediatric): Secondary | ICD-10-CM | POA: Diagnosis not present

## 2022-07-28 DIAGNOSIS — E782 Mixed hyperlipidemia: Secondary | ICD-10-CM | POA: Diagnosis not present

## 2022-07-28 DIAGNOSIS — K7460 Unspecified cirrhosis of liver: Secondary | ICD-10-CM | POA: Diagnosis not present

## 2022-07-28 DIAGNOSIS — I10 Essential (primary) hypertension: Secondary | ICD-10-CM | POA: Diagnosis not present

## 2022-07-28 DIAGNOSIS — E119 Type 2 diabetes mellitus without complications: Secondary | ICD-10-CM | POA: Diagnosis not present

## 2022-07-28 DIAGNOSIS — E1149 Type 2 diabetes mellitus with other diabetic neurological complication: Secondary | ICD-10-CM | POA: Diagnosis not present

## 2022-07-28 DIAGNOSIS — Z6841 Body Mass Index (BMI) 40.0 and over, adult: Secondary | ICD-10-CM | POA: Diagnosis not present

## 2022-07-28 MED ORDER — GABAPENTIN 100 MG PO CAPS
100.0000 mg | ORAL_CAPSULE | Freq: Every day | ORAL | 3 refills | Status: DC
  Filled 2022-07-28: qty 90, 90d supply, fill #0
  Filled 2022-10-14: qty 90, 90d supply, fill #1

## 2022-07-29 ENCOUNTER — Other Ambulatory Visit: Payer: Self-pay

## 2022-07-29 MED ORDER — AREXVY 120 MCG/0.5ML IM SUSR
INTRAMUSCULAR | 0 refills | Status: DC
  Filled 2022-07-29 – 2022-09-05 (×2): qty 0.5, 1d supply, fill #0

## 2022-07-30 ENCOUNTER — Other Ambulatory Visit: Payer: Self-pay | Admitting: Physician Assistant

## 2022-07-30 DIAGNOSIS — I10 Essential (primary) hypertension: Secondary | ICD-10-CM

## 2022-07-30 DIAGNOSIS — K7460 Unspecified cirrhosis of liver: Secondary | ICD-10-CM

## 2022-07-31 DIAGNOSIS — D485 Neoplasm of uncertain behavior of skin: Secondary | ICD-10-CM | POA: Diagnosis not present

## 2022-07-31 DIAGNOSIS — L570 Actinic keratosis: Secondary | ICD-10-CM | POA: Diagnosis not present

## 2022-08-04 ENCOUNTER — Other Ambulatory Visit: Payer: Self-pay

## 2022-08-05 ENCOUNTER — Other Ambulatory Visit: Payer: Self-pay

## 2022-08-06 ENCOUNTER — Other Ambulatory Visit: Payer: Self-pay

## 2022-08-07 ENCOUNTER — Ambulatory Visit
Admission: RE | Admit: 2022-08-07 | Discharge: 2022-08-07 | Disposition: A | Discharge status: Home / Self Care | Payer: 59 | Source: Ambulatory Visit | Attending: Physician Assistant | Admitting: Physician Assistant

## 2022-08-07 ENCOUNTER — Other Ambulatory Visit (HOSPITAL_COMMUNITY): Payer: Self-pay

## 2022-08-07 DIAGNOSIS — I10 Essential (primary) hypertension: Secondary | ICD-10-CM | POA: Diagnosis not present

## 2022-08-07 DIAGNOSIS — K7460 Unspecified cirrhosis of liver: Secondary | ICD-10-CM | POA: Diagnosis not present

## 2022-08-08 ENCOUNTER — Other Ambulatory Visit (HOSPITAL_COMMUNITY): Payer: Self-pay

## 2022-08-08 ENCOUNTER — Other Ambulatory Visit: Payer: Self-pay

## 2022-08-08 MED ORDER — GABAPENTIN 300 MG PO CAPS
300.0000 mg | ORAL_CAPSULE | Freq: Every day | ORAL | 1 refills | Status: DC
  Filled 2022-08-08: qty 90, 90d supply, fill #0

## 2022-08-12 ENCOUNTER — Other Ambulatory Visit (HOSPITAL_COMMUNITY): Payer: Self-pay

## 2022-08-14 DIAGNOSIS — K7460 Unspecified cirrhosis of liver: Secondary | ICD-10-CM | POA: Diagnosis not present

## 2022-08-14 DIAGNOSIS — K7581 Nonalcoholic steatohepatitis (NASH): Secondary | ICD-10-CM | POA: Diagnosis not present

## 2022-08-15 ENCOUNTER — Other Ambulatory Visit (HOSPITAL_COMMUNITY): Payer: Self-pay

## 2022-08-19 ENCOUNTER — Other Ambulatory Visit (HOSPITAL_COMMUNITY): Payer: Self-pay

## 2022-08-19 ENCOUNTER — Other Ambulatory Visit: Payer: Self-pay

## 2022-08-19 MED ORDER — PROPRANOLOL HCL ER 60 MG PO CP24
60.0000 mg | ORAL_CAPSULE | Freq: Every day | ORAL | 1 refills | Status: DC
  Filled 2022-08-19: qty 90, 90d supply, fill #0
  Filled 2022-11-12: qty 90, 90d supply, fill #1
  Filled 2022-11-13: qty 90, 90d supply, fill #0

## 2022-08-19 MED ORDER — FUROSEMIDE 20 MG PO TABS
20.0000 mg | ORAL_TABLET | Freq: Every day | ORAL | 1 refills | Status: DC | PRN
  Filled 2022-08-19: qty 90, 90d supply, fill #0
  Filled 2022-11-30: qty 90, 90d supply, fill #1

## 2022-08-20 ENCOUNTER — Other Ambulatory Visit (HOSPITAL_COMMUNITY): Payer: Self-pay

## 2022-08-25 ENCOUNTER — Telehealth: Payer: Self-pay

## 2022-08-25 NOTE — Telephone Encounter (Signed)
-----   Message from Lin Landsman, MD sent at 08/25/2022  1:33 PM EST ----- Regarding: Right upper quadrant ultrasound Caryl Pina  Please reach out to the patient and let her know that I recommend right upper quadrant ultrasound given her past history of cirrhosis of liver and it has been 1 year to evaluate for any liver lesions  RV

## 2022-08-25 NOTE — Telephone Encounter (Signed)
Patient states she went back to her [facility] liver specialist and then she found out her original GI specialized that diagnosed her with Cirrhosis was back at [facility]. She states she wants to go back to seeing them so does not want to see [place] anymore

## 2022-08-25 NOTE — Telephone Encounter (Signed)
Not sure if it was a direct referral put in to discuss about colonoscopy because she is due for surveillance colonoscopy soon.  Please call the patient and confirm with her.  She cannot keep switching the providers when I took care of her cirrhosis for last 2 years, and I also discussed about the surveillance colonoscopy during her visit with me. Dr. Vira Agar did her colonoscopy in 2019 but I did her EGD for cirrhosis of liver in January 2022  RV

## 2022-08-25 NOTE — Telephone Encounter (Signed)
Looks like patient is transferring her care to [place] has appointment with them on 01/28/2023. Do you still want me to call patient

## 2022-08-27 ENCOUNTER — Other Ambulatory Visit: Payer: Self-pay

## 2022-08-27 ENCOUNTER — Other Ambulatory Visit (HOSPITAL_COMMUNITY): Payer: Self-pay

## 2022-08-27 MED ORDER — SEMAGLUTIDE (2 MG/DOSE) 8 MG/3ML ~~LOC~~ SOPN
2.0000 mg | PEN_INJECTOR | SUBCUTANEOUS | 1 refills | Status: DC
  Filled 2022-08-27: qty 3, 28d supply, fill #0
  Filled 2022-12-13: qty 3, 28d supply, fill #1
  Filled 2023-01-14: qty 3, 28d supply, fill #2
  Filled 2023-02-09: qty 3, 28d supply, fill #3
  Filled 2023-03-10: qty 3, 28d supply, fill #4
  Filled 2023-04-03: qty 3, 28d supply, fill #5

## 2022-08-28 ENCOUNTER — Other Ambulatory Visit: Payer: Self-pay

## 2022-08-29 ENCOUNTER — Other Ambulatory Visit (HOSPITAL_COMMUNITY): Payer: Self-pay

## 2022-08-29 ENCOUNTER — Other Ambulatory Visit: Payer: Self-pay

## 2022-08-29 MED ORDER — COVID-19 MRNA VAC-TRIS(PFIZER) 30 MCG/0.3ML IM SUSY
PREFILLED_SYRINGE | INTRAMUSCULAR | 0 refills | Status: DC
  Filled 2022-09-05: qty 0.3, 1d supply, fill #0

## 2022-09-01 ENCOUNTER — Other Ambulatory Visit: Payer: Self-pay

## 2022-09-03 ENCOUNTER — Other Ambulatory Visit (HOSPITAL_COMMUNITY): Payer: Self-pay

## 2022-09-05 ENCOUNTER — Other Ambulatory Visit: Payer: Self-pay

## 2022-09-10 ENCOUNTER — Other Ambulatory Visit (HOSPITAL_BASED_OUTPATIENT_CLINIC_OR_DEPARTMENT_OTHER): Payer: Self-pay

## 2022-09-10 ENCOUNTER — Other Ambulatory Visit: Payer: Self-pay

## 2022-09-15 ENCOUNTER — Other Ambulatory Visit (HOSPITAL_BASED_OUTPATIENT_CLINIC_OR_DEPARTMENT_OTHER): Payer: Self-pay

## 2022-09-16 DIAGNOSIS — L570 Actinic keratosis: Secondary | ICD-10-CM | POA: Diagnosis not present

## 2022-09-16 DIAGNOSIS — L821 Other seborrheic keratosis: Secondary | ICD-10-CM | POA: Diagnosis not present

## 2022-09-16 DIAGNOSIS — Z872 Personal history of diseases of the skin and subcutaneous tissue: Secondary | ICD-10-CM | POA: Diagnosis not present

## 2022-09-16 DIAGNOSIS — D0339 Melanoma in situ of other parts of face: Secondary | ICD-10-CM | POA: Diagnosis not present

## 2022-09-16 DIAGNOSIS — L720 Epidermal cyst: Secondary | ICD-10-CM | POA: Diagnosis not present

## 2022-09-16 DIAGNOSIS — B372 Candidiasis of skin and nail: Secondary | ICD-10-CM | POA: Diagnosis not present

## 2022-09-16 DIAGNOSIS — Z859 Personal history of malignant neoplasm, unspecified: Secondary | ICD-10-CM | POA: Diagnosis not present

## 2022-09-16 DIAGNOSIS — L578 Other skin changes due to chronic exposure to nonionizing radiation: Secondary | ICD-10-CM | POA: Diagnosis not present

## 2022-09-16 DIAGNOSIS — D485 Neoplasm of uncertain behavior of skin: Secondary | ICD-10-CM | POA: Diagnosis not present

## 2022-09-24 ENCOUNTER — Other Ambulatory Visit (HOSPITAL_COMMUNITY): Payer: Self-pay

## 2022-09-30 ENCOUNTER — Other Ambulatory Visit: Payer: Self-pay

## 2022-10-14 ENCOUNTER — Other Ambulatory Visit: Payer: Self-pay

## 2022-10-15 ENCOUNTER — Other Ambulatory Visit: Payer: Self-pay

## 2022-10-16 ENCOUNTER — Other Ambulatory Visit: Payer: Self-pay

## 2022-10-16 MED ORDER — GABAPENTIN 400 MG PO CAPS
400.0000 mg | ORAL_CAPSULE | Freq: Every day | ORAL | 1 refills | Status: DC
  Filled 2022-10-17: qty 90, 90d supply, fill #0
  Filled 2023-01-09: qty 90, 90d supply, fill #1

## 2022-10-17 ENCOUNTER — Other Ambulatory Visit: Payer: Self-pay

## 2022-11-12 ENCOUNTER — Other Ambulatory Visit (HOSPITAL_COMMUNITY): Payer: Self-pay

## 2022-11-12 ENCOUNTER — Other Ambulatory Visit: Payer: Self-pay

## 2022-11-12 MED ORDER — ZOLPIDEM TARTRATE 5 MG PO TABS
ORAL_TABLET | ORAL | 3 refills | Status: DC
  Filled 2022-11-30: qty 30, 30d supply, fill #0
  Filled 2023-01-09: qty 30, 30d supply, fill #1
  Filled 2023-02-02 – 2023-02-05 (×2): qty 30, 30d supply, fill #2
  Filled 2023-04-23: qty 30, 30d supply, fill #3

## 2022-11-12 MED ORDER — ATORVASTATIN CALCIUM 20 MG PO TABS
20.0000 mg | ORAL_TABLET | Freq: Every day | ORAL | 1 refills | Status: DC
  Filled 2022-11-12 – 2022-11-23 (×2): qty 90, 90d supply, fill #0
  Filled 2023-02-23: qty 90, 90d supply, fill #1

## 2022-11-13 ENCOUNTER — Other Ambulatory Visit (HOSPITAL_COMMUNITY): Payer: Self-pay

## 2022-11-13 ENCOUNTER — Other Ambulatory Visit: Payer: Self-pay

## 2022-11-14 ENCOUNTER — Other Ambulatory Visit: Payer: Self-pay

## 2022-11-14 ENCOUNTER — Other Ambulatory Visit (HOSPITAL_COMMUNITY): Payer: Self-pay

## 2022-11-23 ENCOUNTER — Other Ambulatory Visit: Payer: Self-pay

## 2022-11-24 ENCOUNTER — Other Ambulatory Visit: Payer: Self-pay

## 2022-11-24 ENCOUNTER — Other Ambulatory Visit: Payer: Self-pay | Admitting: Physician Assistant

## 2022-11-24 DIAGNOSIS — Z1231 Encounter for screening mammogram for malignant neoplasm of breast: Secondary | ICD-10-CM

## 2022-11-24 MED ORDER — SPIRONOLACTONE 50 MG PO TABS
50.0000 mg | ORAL_TABLET | Freq: Every day | ORAL | 1 refills | Status: DC
  Filled 2022-11-24: qty 90, 90d supply, fill #0
  Filled 2023-02-23: qty 90, 90d supply, fill #1

## 2022-11-24 MED ORDER — METFORMIN HCL 500 MG PO TABS
500.0000 mg | ORAL_TABLET | Freq: Every day | ORAL | 1 refills | Status: DC
  Filled 2022-11-24: qty 90, 90d supply, fill #0
  Filled 2023-02-23: qty 90, 90d supply, fill #1

## 2022-11-30 ENCOUNTER — Other Ambulatory Visit: Payer: Self-pay

## 2022-12-01 ENCOUNTER — Other Ambulatory Visit: Payer: Self-pay

## 2022-12-03 ENCOUNTER — Other Ambulatory Visit: Payer: Self-pay

## 2022-12-19 ENCOUNTER — Other Ambulatory Visit: Payer: Self-pay

## 2022-12-24 ENCOUNTER — Other Ambulatory Visit: Payer: Self-pay

## 2022-12-24 DIAGNOSIS — Z872 Personal history of diseases of the skin and subcutaneous tissue: Secondary | ICD-10-CM | POA: Diagnosis not present

## 2022-12-24 DIAGNOSIS — Z859 Personal history of malignant neoplasm, unspecified: Secondary | ICD-10-CM | POA: Diagnosis not present

## 2022-12-24 DIAGNOSIS — B372 Candidiasis of skin and nail: Secondary | ICD-10-CM | POA: Diagnosis not present

## 2022-12-24 DIAGNOSIS — Z85820 Personal history of malignant melanoma of skin: Secondary | ICD-10-CM | POA: Diagnosis not present

## 2022-12-24 DIAGNOSIS — L578 Other skin changes due to chronic exposure to nonionizing radiation: Secondary | ICD-10-CM | POA: Diagnosis not present

## 2022-12-24 MED ORDER — NYSTATIN 100000 UNIT/GM EX POWD
1.0000 | Freq: Three times a day (TID) | CUTANEOUS | 11 refills | Status: AC | PRN
  Filled 2022-12-24: qty 60, 20d supply, fill #0
  Filled 2023-01-20: qty 60, 20d supply, fill #1
  Filled 2023-04-02: qty 60, 20d supply, fill #2
  Filled 2023-05-12: qty 60, 20d supply, fill #3
  Filled 2023-06-08: qty 60, 20d supply, fill #4
  Filled 2023-07-04: qty 60, 20d supply, fill #5
  Filled 2023-08-17: qty 60, 20d supply, fill #6

## 2022-12-29 ENCOUNTER — Ambulatory Visit
Admission: RE | Admit: 2022-12-29 | Discharge: 2022-12-29 | Disposition: A | Discharge status: Home / Self Care | Payer: 59 | Source: Ambulatory Visit | Attending: Acute Care | Admitting: Acute Care

## 2022-12-29 DIAGNOSIS — I700 Atherosclerosis of aorta: Secondary | ICD-10-CM | POA: Diagnosis not present

## 2022-12-29 DIAGNOSIS — J439 Emphysema, unspecified: Secondary | ICD-10-CM | POA: Insufficient documentation

## 2022-12-29 DIAGNOSIS — Z87891 Personal history of nicotine dependence: Secondary | ICD-10-CM | POA: Diagnosis not present

## 2022-12-29 DIAGNOSIS — Z122 Encounter for screening for malignant neoplasm of respiratory organs: Secondary | ICD-10-CM

## 2022-12-30 DIAGNOSIS — G4733 Obstructive sleep apnea (adult) (pediatric): Secondary | ICD-10-CM | POA: Diagnosis not present

## 2023-01-01 ENCOUNTER — Other Ambulatory Visit: Payer: Self-pay | Admitting: Acute Care

## 2023-01-01 DIAGNOSIS — Z122 Encounter for screening for malignant neoplasm of respiratory organs: Secondary | ICD-10-CM

## 2023-01-01 DIAGNOSIS — Z87891 Personal history of nicotine dependence: Secondary | ICD-10-CM

## 2023-01-09 ENCOUNTER — Other Ambulatory Visit: Payer: Self-pay

## 2023-01-09 ENCOUNTER — Ambulatory Visit
Admission: RE | Admit: 2023-01-09 | Discharge: 2023-01-09 | Disposition: A | Discharge status: Home / Self Care | Payer: 59 | Source: Ambulatory Visit | Attending: Physician Assistant | Admitting: Physician Assistant

## 2023-01-09 DIAGNOSIS — Z1231 Encounter for screening mammogram for malignant neoplasm of breast: Secondary | ICD-10-CM | POA: Diagnosis not present

## 2023-01-20 ENCOUNTER — Other Ambulatory Visit: Payer: Self-pay

## 2023-01-20 DIAGNOSIS — E119 Type 2 diabetes mellitus without complications: Secondary | ICD-10-CM | POA: Diagnosis not present

## 2023-01-20 DIAGNOSIS — I10 Essential (primary) hypertension: Secondary | ICD-10-CM | POA: Diagnosis not present

## 2023-01-20 DIAGNOSIS — E782 Mixed hyperlipidemia: Secondary | ICD-10-CM | POA: Diagnosis not present

## 2023-01-27 ENCOUNTER — Other Ambulatory Visit: Payer: Self-pay

## 2023-01-27 DIAGNOSIS — M7662 Achilles tendinitis, left leg: Secondary | ICD-10-CM | POA: Diagnosis not present

## 2023-01-27 DIAGNOSIS — E782 Mixed hyperlipidemia: Secondary | ICD-10-CM | POA: Diagnosis not present

## 2023-01-27 DIAGNOSIS — E119 Type 2 diabetes mellitus without complications: Secondary | ICD-10-CM | POA: Diagnosis not present

## 2023-01-27 DIAGNOSIS — Z0000 Encounter for general adult medical examination without abnormal findings: Secondary | ICD-10-CM | POA: Diagnosis not present

## 2023-01-27 DIAGNOSIS — Z6841 Body Mass Index (BMI) 40.0 and over, adult: Secondary | ICD-10-CM | POA: Diagnosis not present

## 2023-01-27 DIAGNOSIS — I10 Essential (primary) hypertension: Secondary | ICD-10-CM

## 2023-01-27 DIAGNOSIS — G4733 Obstructive sleep apnea (adult) (pediatric): Secondary | ICD-10-CM | POA: Diagnosis not present

## 2023-01-27 DIAGNOSIS — K7460 Unspecified cirrhosis of liver: Secondary | ICD-10-CM | POA: Diagnosis not present

## 2023-01-27 DIAGNOSIS — F5104 Psychophysiologic insomnia: Secondary | ICD-10-CM | POA: Diagnosis not present

## 2023-01-27 DIAGNOSIS — H532 Diplopia: Secondary | ICD-10-CM | POA: Diagnosis not present

## 2023-01-27 MED ORDER — LIDOCAINE 5 % EX PTCH
1.0000 | MEDICATED_PATCH | Freq: Every day | CUTANEOUS | 0 refills | Status: AC
  Filled 2023-01-27: qty 30, 30d supply, fill #0

## 2023-01-28 ENCOUNTER — Other Ambulatory Visit: Payer: Self-pay | Admitting: Physician Assistant

## 2023-01-28 DIAGNOSIS — Z780 Asymptomatic menopausal state: Secondary | ICD-10-CM

## 2023-01-29 ENCOUNTER — Other Ambulatory Visit: Payer: Self-pay | Admitting: Physician Assistant

## 2023-01-29 ENCOUNTER — Ambulatory Visit
Admission: RE | Admit: 2023-01-29 | Discharge: 2023-01-29 | Disposition: A | Discharge status: Home / Self Care | Payer: 59 | Source: Ambulatory Visit | Attending: Physician Assistant | Admitting: Physician Assistant

## 2023-01-29 DIAGNOSIS — K7689 Other specified diseases of liver: Secondary | ICD-10-CM | POA: Diagnosis not present

## 2023-01-29 DIAGNOSIS — C4330 Malignant melanoma of unspecified part of face: Secondary | ICD-10-CM

## 2023-01-29 DIAGNOSIS — I10 Essential (primary) hypertension: Secondary | ICD-10-CM | POA: Diagnosis not present

## 2023-01-29 DIAGNOSIS — K7460 Unspecified cirrhosis of liver: Secondary | ICD-10-CM | POA: Diagnosis not present

## 2023-01-30 DIAGNOSIS — G4733 Obstructive sleep apnea (adult) (pediatric): Secondary | ICD-10-CM | POA: Diagnosis not present

## 2023-01-31 ENCOUNTER — Ambulatory Visit
Admission: RE | Admit: 2023-01-31 | Discharge: 2023-01-31 | Disposition: A | Discharge status: Home / Self Care | Payer: 59 | Source: Ambulatory Visit | Attending: Physician Assistant | Admitting: Physician Assistant

## 2023-01-31 DIAGNOSIS — C4330 Malignant melanoma of unspecified part of face: Secondary | ICD-10-CM

## 2023-01-31 DIAGNOSIS — R55 Syncope and collapse: Secondary | ICD-10-CM | POA: Diagnosis not present

## 2023-01-31 MED ORDER — GADOBUTROL 1 MMOL/ML IV SOLN
10.0000 mL | Freq: Once | INTRAVENOUS | Status: AC | PRN
  Administered 2023-01-31: 10 mL via INTRAVENOUS

## 2023-02-02 ENCOUNTER — Other Ambulatory Visit: Payer: Self-pay

## 2023-02-02 DIAGNOSIS — M722 Plantar fascial fibromatosis: Secondary | ICD-10-CM | POA: Diagnosis not present

## 2023-02-02 DIAGNOSIS — H532 Diplopia: Secondary | ICD-10-CM | POA: Diagnosis not present

## 2023-02-02 DIAGNOSIS — H5010 Unspecified exotropia: Secondary | ICD-10-CM | POA: Diagnosis not present

## 2023-02-02 DIAGNOSIS — M79672 Pain in left foot: Secondary | ICD-10-CM | POA: Diagnosis not present

## 2023-02-02 DIAGNOSIS — M7732 Calcaneal spur, left foot: Secondary | ICD-10-CM | POA: Diagnosis not present

## 2023-02-02 DIAGNOSIS — M216X1 Other acquired deformities of right foot: Secondary | ICD-10-CM | POA: Diagnosis not present

## 2023-02-02 DIAGNOSIS — M216X2 Other acquired deformities of left foot: Secondary | ICD-10-CM | POA: Diagnosis not present

## 2023-02-02 DIAGNOSIS — H2513 Age-related nuclear cataract, bilateral: Secondary | ICD-10-CM | POA: Diagnosis not present

## 2023-02-02 DIAGNOSIS — E119 Type 2 diabetes mellitus without complications: Secondary | ICD-10-CM | POA: Diagnosis not present

## 2023-02-05 ENCOUNTER — Other Ambulatory Visit: Payer: Self-pay

## 2023-02-09 ENCOUNTER — Other Ambulatory Visit: Payer: Self-pay

## 2023-02-09 ENCOUNTER — Other Ambulatory Visit (HOSPITAL_BASED_OUTPATIENT_CLINIC_OR_DEPARTMENT_OTHER): Payer: Self-pay

## 2023-02-09 MED ORDER — PROPRANOLOL HCL ER 60 MG PO CP24
60.0000 mg | ORAL_CAPSULE | Freq: Every day | ORAL | 1 refills | Status: DC
  Filled 2023-02-09: qty 90, 90d supply, fill #0
  Filled 2023-05-12: qty 90, 90d supply, fill #1

## 2023-02-12 ENCOUNTER — Other Ambulatory Visit: Payer: Self-pay

## 2023-02-12 DIAGNOSIS — K649 Unspecified hemorrhoids: Secondary | ICD-10-CM | POA: Diagnosis not present

## 2023-02-12 MED ORDER — HYDROCORTISONE ACETATE 25 MG RE SUPP
25.0000 mg | Freq: Two times a day (BID) | RECTAL | 11 refills | Status: DC
  Filled 2023-02-12: qty 60, 30d supply, fill #0
  Filled 2023-04-02: qty 60, 30d supply, fill #1

## 2023-02-13 ENCOUNTER — Other Ambulatory Visit: Payer: Self-pay

## 2023-03-01 DIAGNOSIS — G4733 Obstructive sleep apnea (adult) (pediatric): Secondary | ICD-10-CM | POA: Diagnosis not present

## 2023-03-02 DIAGNOSIS — E119 Type 2 diabetes mellitus without complications: Secondary | ICD-10-CM | POA: Diagnosis not present

## 2023-03-02 DIAGNOSIS — M216X1 Other acquired deformities of right foot: Secondary | ICD-10-CM | POA: Diagnosis not present

## 2023-03-02 DIAGNOSIS — M216X2 Other acquired deformities of left foot: Secondary | ICD-10-CM | POA: Diagnosis not present

## 2023-03-02 DIAGNOSIS — M79672 Pain in left foot: Secondary | ICD-10-CM | POA: Diagnosis not present

## 2023-03-02 DIAGNOSIS — M722 Plantar fascial fibromatosis: Secondary | ICD-10-CM | POA: Diagnosis not present

## 2023-03-02 DIAGNOSIS — M7732 Calcaneal spur, left foot: Secondary | ICD-10-CM | POA: Diagnosis not present

## 2023-03-09 ENCOUNTER — Other Ambulatory Visit: Payer: Self-pay

## 2023-03-09 MED ORDER — FUROSEMIDE 20 MG PO TABS
20.0000 mg | ORAL_TABLET | Freq: Every day | ORAL | 1 refills | Status: DC | PRN
  Filled 2023-03-09: qty 90, 90d supply, fill #0
  Filled 2023-06-14: qty 90, 90d supply, fill #1

## 2023-03-09 MED ORDER — GABAPENTIN 400 MG PO CAPS
400.0000 mg | ORAL_CAPSULE | Freq: Every day | ORAL | 1 refills | Status: DC
  Filled 2023-03-09 – 2023-04-09 (×2): qty 90, 90d supply, fill #0
  Filled 2023-07-04: qty 90, 90d supply, fill #1

## 2023-03-10 ENCOUNTER — Other Ambulatory Visit: Payer: Self-pay

## 2023-03-10 MED ORDER — ZOLPIDEM TARTRATE 5 MG PO TABS
5.0000 mg | ORAL_TABLET | Freq: Every evening | ORAL | 2 refills | Status: DC | PRN
  Filled 2023-03-10: qty 30, 30d supply, fill #0
  Filled 2023-05-18: qty 15, 15d supply, fill #1
  Filled 2023-05-22: qty 30, 30d supply, fill #1
  Filled 2023-07-04: qty 30, 30d supply, fill #2

## 2023-03-16 ENCOUNTER — Other Ambulatory Visit: Payer: Self-pay

## 2023-03-16 MED ORDER — METHOCARBAMOL 750 MG PO TABS
750.0000 mg | ORAL_TABLET | Freq: Every day | ORAL | 0 refills | Status: DC | PRN
  Filled 2023-03-16 – 2023-04-02 (×2): qty 90, 90d supply, fill #0

## 2023-03-23 ENCOUNTER — Other Ambulatory Visit: Payer: Self-pay

## 2023-03-27 ENCOUNTER — Other Ambulatory Visit: Payer: Self-pay

## 2023-04-02 ENCOUNTER — Other Ambulatory Visit: Payer: Self-pay

## 2023-04-03 ENCOUNTER — Other Ambulatory Visit: Payer: Self-pay

## 2023-04-09 ENCOUNTER — Other Ambulatory Visit: Payer: Self-pay

## 2023-04-09 DIAGNOSIS — Z86010 Personal history of colonic polyps: Secondary | ICD-10-CM | POA: Diagnosis not present

## 2023-04-09 DIAGNOSIS — K219 Gastro-esophageal reflux disease without esophagitis: Secondary | ICD-10-CM | POA: Diagnosis not present

## 2023-04-09 DIAGNOSIS — K7469 Other cirrhosis of liver: Secondary | ICD-10-CM | POA: Diagnosis not present

## 2023-04-09 MED ORDER — NA SULFATE-K SULFATE-MG SULF 17.5-3.13-1.6 GM/177ML PO SOLN
Freq: Once | ORAL | 0 refills | Status: DC
  Filled 2023-04-09: qty 354, 1d supply, fill #0

## 2023-04-15 ENCOUNTER — Ambulatory Visit
Admission: RE | Admit: 2023-04-15 | Discharge: 2023-04-15 | Disposition: A | Discharge status: Home / Self Care | Payer: 59 | Source: Ambulatory Visit | Attending: Physician Assistant | Admitting: Physician Assistant

## 2023-04-15 DIAGNOSIS — Z780 Asymptomatic menopausal state: Secondary | ICD-10-CM | POA: Diagnosis not present

## 2023-04-15 DIAGNOSIS — J449 Chronic obstructive pulmonary disease, unspecified: Secondary | ICD-10-CM | POA: Diagnosis not present

## 2023-04-24 ENCOUNTER — Other Ambulatory Visit: Payer: Self-pay

## 2023-04-26 ENCOUNTER — Other Ambulatory Visit: Payer: Self-pay

## 2023-04-26 MED ORDER — OZEMPIC (2 MG/DOSE) 8 MG/3ML ~~LOC~~ SOPN
PEN_INJECTOR | SUBCUTANEOUS | 1 refills | Status: DC
  Filled 2023-04-26: qty 9, 84d supply, fill #0

## 2023-04-27 ENCOUNTER — Other Ambulatory Visit: Payer: Self-pay

## 2023-05-04 IMAGING — US US ABDOMEN COMPLETE
1 series · 13 of 25 positions shown · non-contrast
Comparison: Right upper quadrant ultrasound 07/18/2021.

CLINICAL DATA: HCC screening.  Cirrhosis.

EXAM:
ABDOMEN ULTRASOUND COMPLETE

[Series 1: us abdomen complete · 0.30mm/px · 13 of 106 slices shown]
[im 1/106]
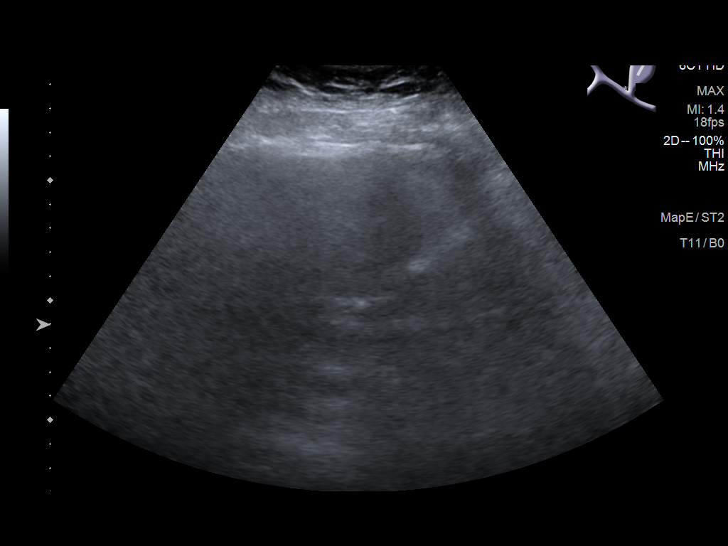
[im 9/106]
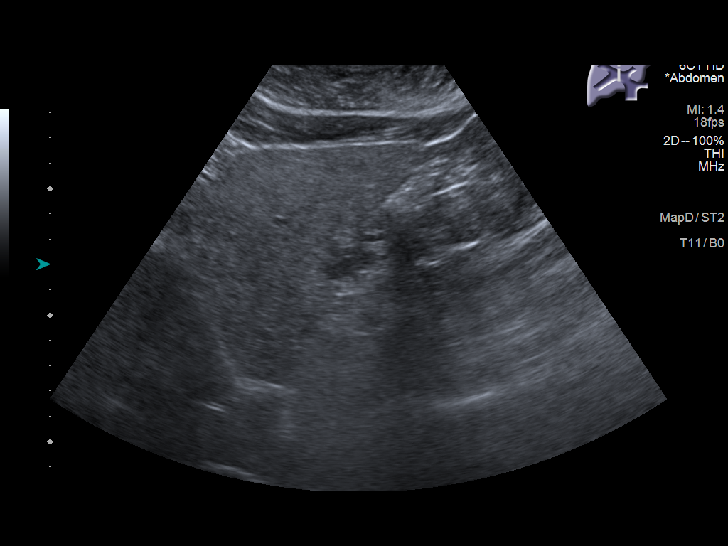
[im 18/106]
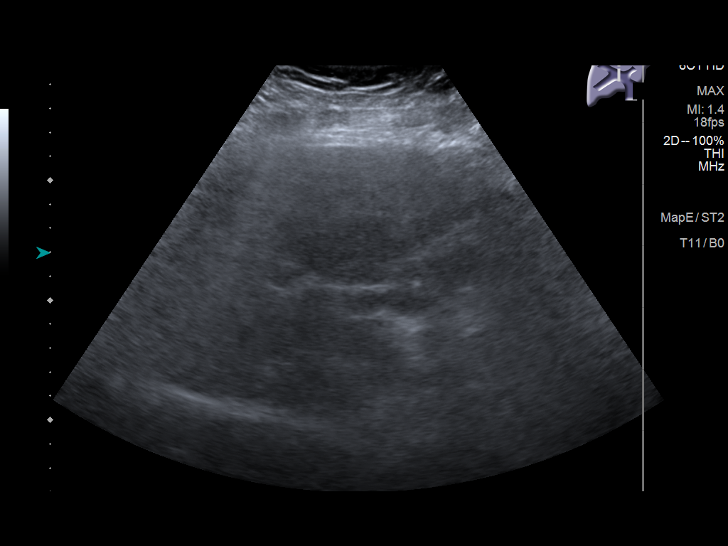
[im 27/106]
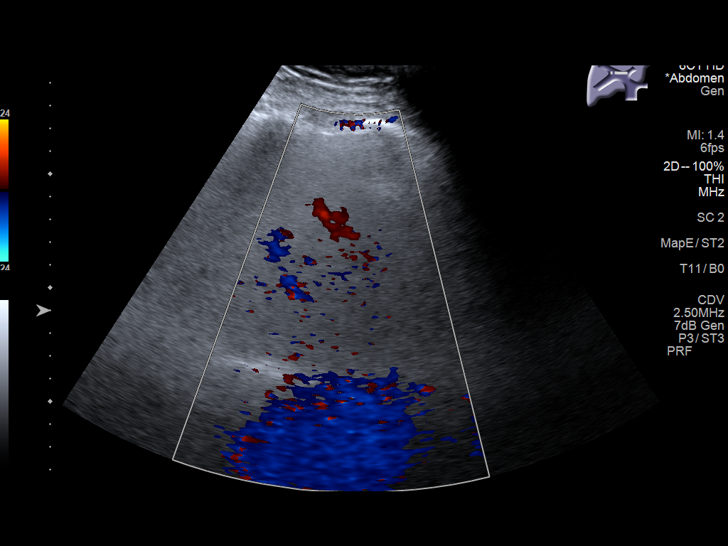
[im 36/106]
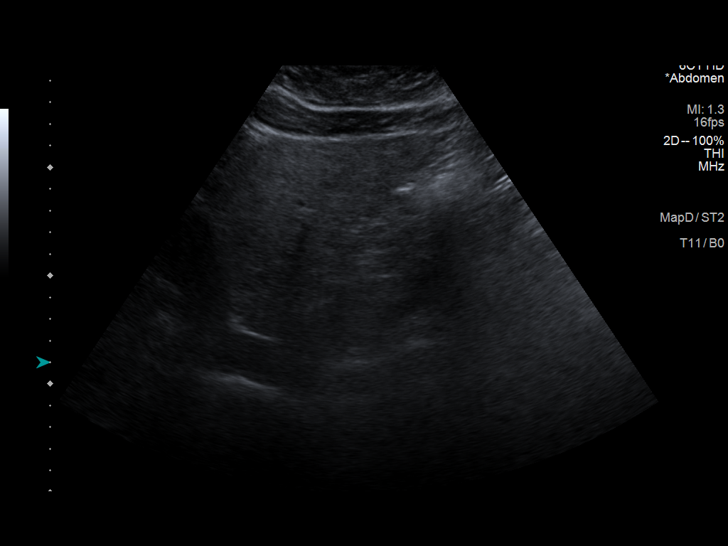
[im 44/106]
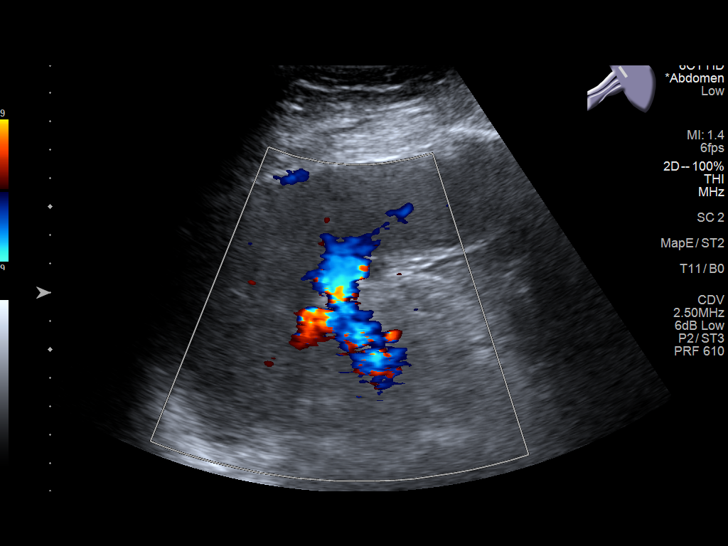
[im 53/106]
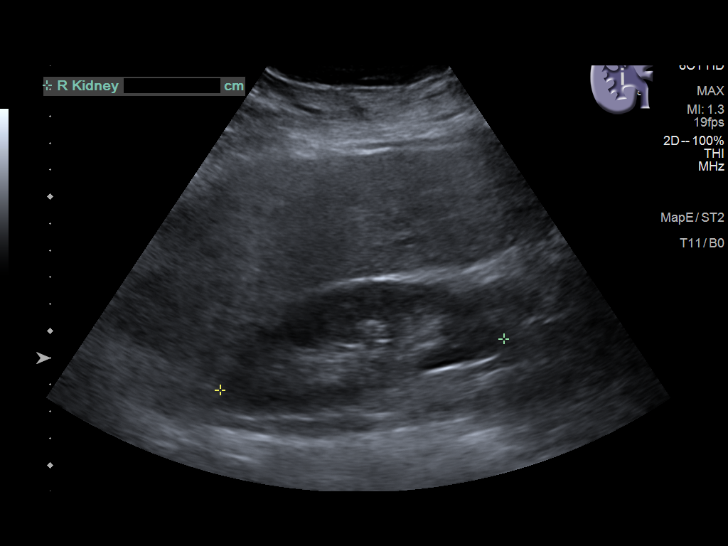
[im 62/106]
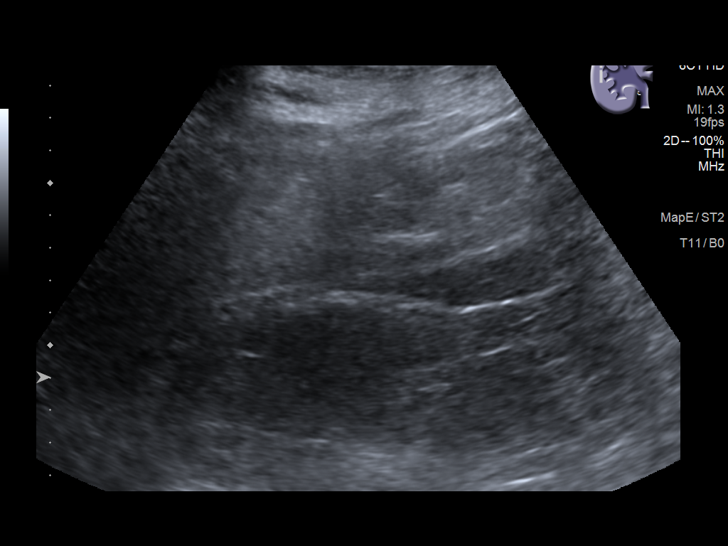
[im 71/106]
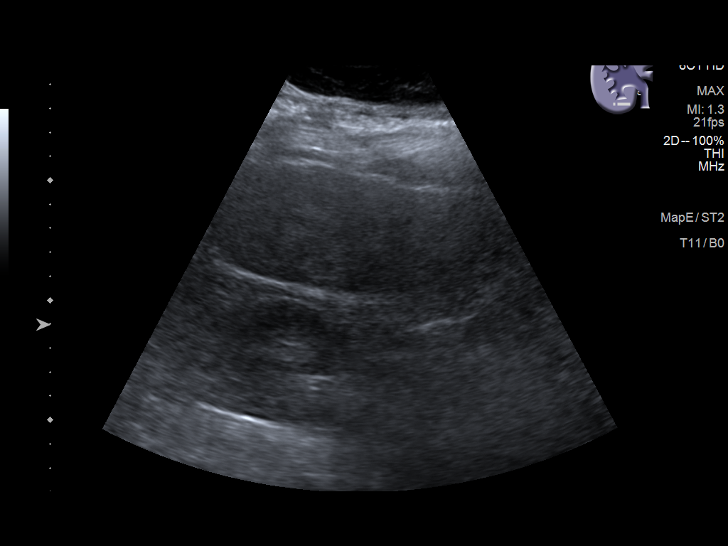
[im 79/106]
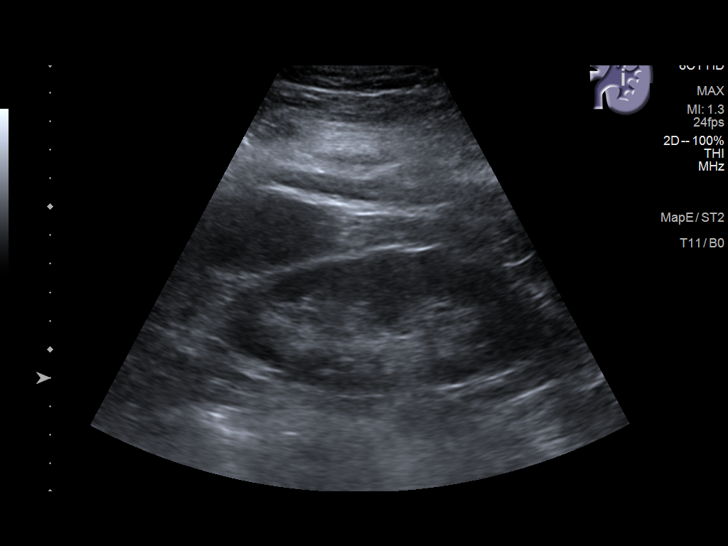
[im 88/106]
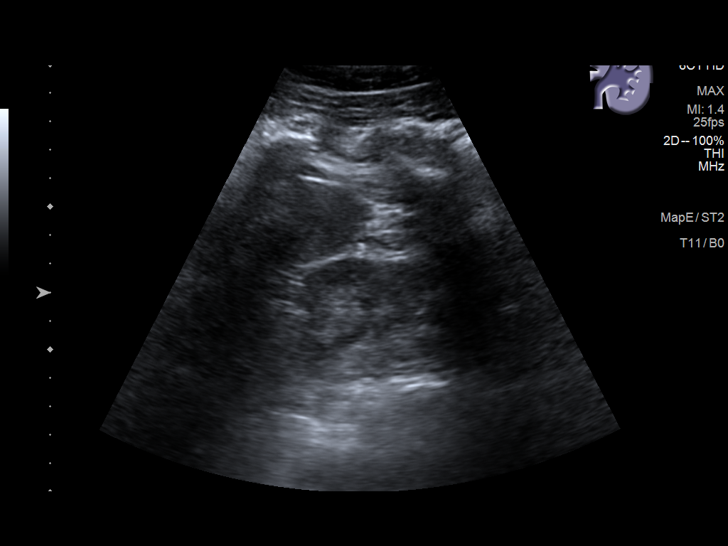
[im 97/106]
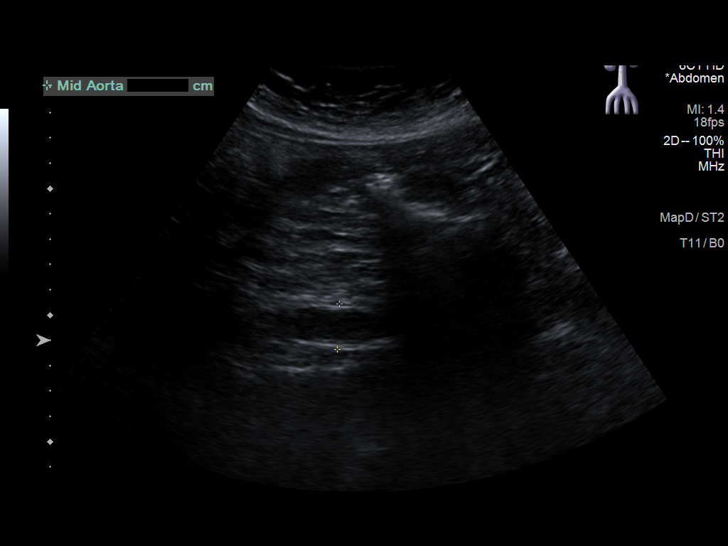
[im 106/106]
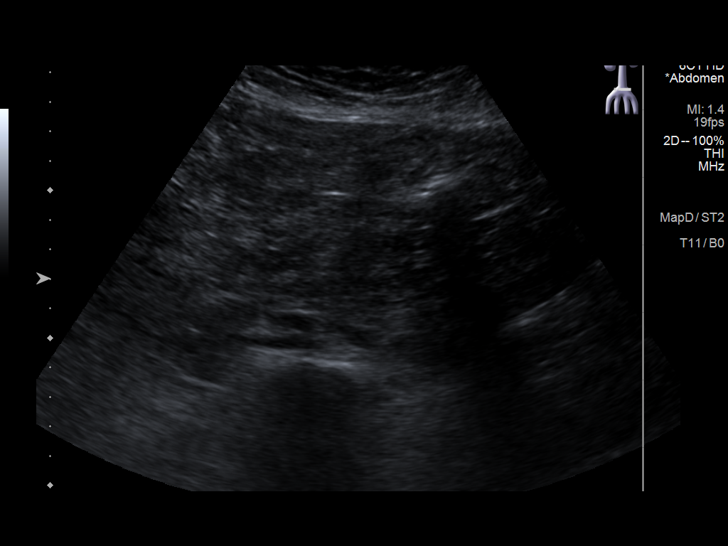

[13 of 25 positions shown; findings below may reference images not displayed]

FINDINGS: Gallbladder: Surgically absent.

Common bile duct: Diameter: 3 mm.

Liver: Limited due to poor penetration related to habitus.
Heterogeneous increased parenchyma. No focal hepatic lesion. There
is no convincing capsular nodularity. Portal vein is patent on color
Doppler imaging with normal direction of blood flow towards the
liver.

IVC: No abnormality visualized.

Pancreas: Not visualized, obscured by bowel gas.

Spleen: Size and appearance within normal limits. Splenic length of
10.7 cm.

Right Kidney: Length: 10.7 cm. Normal parenchymal echogenicity. No
hydronephrosis. No visualized stone or focal lesion.

Left Kidney: Length: 10.9 cm. Normal parenchymal echogenicity. No
hydronephrosis. No visualized stone or focal lesion.

Abdominal aorta: No aneurysm visualized. Portions of the aorta are
obscured by bowel gas.

Other findings: No abdominal ascites.
IMPRESSION: 1. Diffusely increased and heterogeneous parenchymal echogenicity,
typically seen with steatosis or other intrinsic hepatocellular
disease. No focal hepatic lesion or morphologic changes of
cirrhosis.
2. Cholecystectomy without biliary dilatation.

## 2023-05-08 DIAGNOSIS — G4733 Obstructive sleep apnea (adult) (pediatric): Secondary | ICD-10-CM | POA: Diagnosis not present

## 2023-05-15 DIAGNOSIS — G4733 Obstructive sleep apnea (adult) (pediatric): Secondary | ICD-10-CM | POA: Diagnosis not present

## 2023-05-18 ENCOUNTER — Other Ambulatory Visit: Payer: Self-pay

## 2023-05-19 ENCOUNTER — Other Ambulatory Visit: Payer: Self-pay

## 2023-05-20 ENCOUNTER — Other Ambulatory Visit: Payer: Self-pay

## 2023-05-20 MED ORDER — GLUCOSE BLOOD VI STRP
ORAL_STRIP | 8 refills | Status: AC
  Filled 2023-05-20: qty 100, 33d supply, fill #0
  Filled 2023-08-17: qty 100, 33d supply, fill #1

## 2023-05-20 MED ORDER — SPIRONOLACTONE 50 MG PO TABS
50.0000 mg | ORAL_TABLET | Freq: Every day | ORAL | 1 refills | Status: DC
  Filled 2023-05-20: qty 90, 90d supply, fill #0
  Filled 2023-08-17: qty 90, 90d supply, fill #1

## 2023-05-20 MED ORDER — METFORMIN HCL 500 MG PO TABS
500.0000 mg | ORAL_TABLET | Freq: Every day | ORAL | 1 refills | Status: DC
  Filled 2023-05-20: qty 90, 90d supply, fill #0
  Filled 2023-08-17: qty 90, 90d supply, fill #1

## 2023-05-20 MED ORDER — METHOCARBAMOL 750 MG PO TABS
750.0000 mg | ORAL_TABLET | Freq: Every day | ORAL | 0 refills | Status: AC | PRN
  Filled 2023-05-20 – 2023-08-17 (×2): qty 90, 90d supply, fill #0

## 2023-05-20 MED ORDER — ATORVASTATIN CALCIUM 20 MG PO TABS
20.0000 mg | ORAL_TABLET | Freq: Every day | ORAL | 1 refills | Status: AC
  Filled 2023-05-20: qty 90, 90d supply, fill #0
  Filled 2023-08-17: qty 90, 90d supply, fill #1

## 2023-05-22 ENCOUNTER — Other Ambulatory Visit: Payer: Self-pay

## 2023-06-09 ENCOUNTER — Other Ambulatory Visit: Payer: Self-pay

## 2023-06-12 ENCOUNTER — Encounter: Payer: Self-pay | Admitting: Internal Medicine

## 2023-06-15 ENCOUNTER — Encounter: Payer: Self-pay | Admitting: Internal Medicine

## 2023-06-16 ENCOUNTER — Encounter: Payer: Self-pay | Admitting: Internal Medicine

## 2023-06-23 ENCOUNTER — Encounter
Admission: RE | Disposition: A | Discharge status: Home / Self Care | Payer: Self-pay | Source: Home / Self Care | Attending: Internal Medicine

## 2023-06-23 ENCOUNTER — Ambulatory Visit: Payer: 59 | Admitting: Anesthesiology

## 2023-06-23 ENCOUNTER — Ambulatory Visit
Admission: RE | Admit: 2023-06-23 | Discharge: 2023-06-23 | Disposition: A | Discharge status: Home / Self Care | Payer: 59 | Attending: Internal Medicine | Admitting: Internal Medicine

## 2023-06-23 ENCOUNTER — Encounter: Payer: Self-pay | Admitting: Internal Medicine

## 2023-06-23 ENCOUNTER — Other Ambulatory Visit: Payer: Self-pay

## 2023-06-23 DIAGNOSIS — M1990 Unspecified osteoarthritis, unspecified site: Secondary | ICD-10-CM | POA: Insufficient documentation

## 2023-06-23 DIAGNOSIS — Z7985 Long-term (current) use of injectable non-insulin antidiabetic drugs: Secondary | ICD-10-CM | POA: Diagnosis not present

## 2023-06-23 DIAGNOSIS — K219 Gastro-esophageal reflux disease without esophagitis: Secondary | ICD-10-CM | POA: Diagnosis not present

## 2023-06-23 DIAGNOSIS — K7581 Nonalcoholic steatohepatitis (NASH): Secondary | ICD-10-CM | POA: Diagnosis not present

## 2023-06-23 DIAGNOSIS — J449 Chronic obstructive pulmonary disease, unspecified: Secondary | ICD-10-CM | POA: Diagnosis not present

## 2023-06-23 DIAGNOSIS — Z1211 Encounter for screening for malignant neoplasm of colon: Secondary | ICD-10-CM | POA: Insufficient documentation

## 2023-06-23 DIAGNOSIS — Z87891 Personal history of nicotine dependence: Secondary | ICD-10-CM | POA: Diagnosis not present

## 2023-06-23 DIAGNOSIS — Z09 Encounter for follow-up examination after completed treatment for conditions other than malignant neoplasm: Secondary | ICD-10-CM | POA: Diagnosis not present

## 2023-06-23 DIAGNOSIS — I10 Essential (primary) hypertension: Secondary | ICD-10-CM | POA: Diagnosis not present

## 2023-06-23 DIAGNOSIS — Z6841 Body Mass Index (BMI) 40.0 and over, adult: Secondary | ICD-10-CM | POA: Diagnosis not present

## 2023-06-23 DIAGNOSIS — K649 Unspecified hemorrhoids: Secondary | ICD-10-CM | POA: Diagnosis not present

## 2023-06-23 DIAGNOSIS — K766 Portal hypertension: Secondary | ICD-10-CM | POA: Diagnosis not present

## 2023-06-23 DIAGNOSIS — K7460 Unspecified cirrhosis of liver: Secondary | ICD-10-CM | POA: Diagnosis not present

## 2023-06-23 DIAGNOSIS — K5730 Diverticulosis of large intestine without perforation or abscess without bleeding: Secondary | ICD-10-CM | POA: Diagnosis not present

## 2023-06-23 DIAGNOSIS — Z7984 Long term (current) use of oral hypoglycemic drugs: Secondary | ICD-10-CM | POA: Diagnosis not present

## 2023-06-23 DIAGNOSIS — K640 First degree hemorrhoids: Secondary | ICD-10-CM | POA: Diagnosis not present

## 2023-06-23 DIAGNOSIS — G4730 Sleep apnea, unspecified: Secondary | ICD-10-CM | POA: Insufficient documentation

## 2023-06-23 DIAGNOSIS — K5790 Diverticulosis of intestine, part unspecified, without perforation or abscess without bleeding: Secondary | ICD-10-CM | POA: Diagnosis not present

## 2023-06-23 DIAGNOSIS — K7469 Other cirrhosis of liver: Secondary | ICD-10-CM | POA: Diagnosis not present

## 2023-06-23 DIAGNOSIS — E119 Type 2 diabetes mellitus without complications: Secondary | ICD-10-CM | POA: Insufficient documentation

## 2023-06-23 DIAGNOSIS — Z860101 Personal history of adenomatous and serrated colon polyps: Secondary | ICD-10-CM | POA: Diagnosis not present

## 2023-06-23 HISTORY — PX: ESOPHAGOGASTRODUODENOSCOPY (EGD) WITH PROPOFOL: SHX5813

## 2023-06-23 HISTORY — PX: COLONOSCOPY WITH PROPOFOL: SHX5780

## 2023-06-23 LAB — GLUCOSE, CAPILLARY: Glucose-Capillary: 130 mg/dL — ABNORMAL HIGH (ref 70–99)

## 2023-06-23 SURGERY — COLONOSCOPY WITH PROPOFOL
Anesthesia: General

## 2023-06-23 MED ORDER — LIDOCAINE HCL (CARDIAC) PF 100 MG/5ML IV SOSY
PREFILLED_SYRINGE | INTRAVENOUS | Status: DC | PRN
  Administered 2023-06-23: 50 mg via INTRAVENOUS

## 2023-06-23 MED ORDER — PROPOFOL 10 MG/ML IV BOLUS
INTRAVENOUS | Status: DC | PRN
  Administered 2023-06-23: 80 mg via INTRAVENOUS
  Administered 2023-06-23: 150 ug/kg/min via INTRAVENOUS

## 2023-06-23 MED ORDER — DEXMEDETOMIDINE HCL IN NACL 80 MCG/20ML IV SOLN
INTRAVENOUS | Status: AC
  Filled 2023-06-23: qty 20

## 2023-06-23 MED ORDER — SODIUM CHLORIDE 0.9 % IV SOLN: INTRAVENOUS | Status: DC

## 2023-06-23 NOTE — Op Note (Signed)
[facility] Gastroenterology Patient Name: Patricia [name] Procedure Date: 06/23/2023 9:33 AM MRN: [id number] Account #: [id number] Date of Birth: [birth year] Admit Type: Outpatient Age: 67 Room: [facility] ENDO ROOM 4 Gender: Female Note Status: Finalized Instrument Name: Prentice Docker [id number] Procedure:             Colonoscopy Indications:           Surveillance: Personal history of adenomatous polyps                         on last colonoscopy > 5 years ago Providers:             Royce Macadamia K. Peni Rupard MD, MD Medicines:             Propofol per Anesthesia Complications:         No immediate complications. Procedure:             Pre-Anesthesia Assessment:                        - The risks and benefits of the procedure and the                         sedation options and risks were discussed with the                         patient. All questions were answered and informed                         consent was obtained.                        - Patient identification and proposed procedure were                         verified prior to the procedure by the nurse. The                         procedure was verified in the procedure room.                        - ASA Grade Assessment: III - A patient with severe                         systemic disease.                        - After reviewing the risks and benefits, the patient                         was deemed in satisfactory condition to undergo the                         procedure.                        After obtaining informed consent, the colonoscope was                         passed under direct vision. Throughout the procedure,  the patient's blood pressure, pulse, and oxygen                         saturations were monitored continuously. The                         Colonoscope was introduced through the anus and                         advanced to the the cecum, identified by appendiceal                          orifice and ileocecal valve. The colonoscopy was                         performed without difficulty. The patient tolerated                         the procedure well. The quality of the bowel                         preparation was good. The ileocecal valve, appendiceal                         orifice, and rectum were photographed. Findings:      The perianal and digital rectal examinations were normal. Pertinent       negatives include normal sphincter tone and no palpable rectal lesions.      Non-bleeding internal hemorrhoids were found during retroflexion. The       hemorrhoids were Grade I (internal hemorrhoids that do not prolapse).      Multiple medium-mouthed and small-mouthed diverticula were found in the       sigmoid colon. There was no evidence of diverticular bleeding.      The exam was otherwise without abnormality. Impression:            - Non-bleeding internal hemorrhoids.                        - Mild diverticulosis in the sigmoid colon. There was                         no evidence of diverticular bleeding.                        - The examination was otherwise normal.                        - No specimens collected. Recommendation:        - Patient has a contact number available for                         emergencies. The signs and symptoms of potential                         delayed complications were discussed with the patient.                         Return to normal activities tomorrow. Written  discharge instructions were provided to the patient.                        - Resume previous diet.                        - Continue present medications.                        - Repeat colonoscopy in 10 years for screening                         purposes.                        - Follow up with Fransico Setters, NP at [facility]                         Gastroenterology.                        - The findings and recommendations  were discussed with                         the patient. Procedure Code(s):     --- Professional ---                        M5784, Colorectal cancer screening; colonoscopy on                         individual at high risk Diagnosis Code(s):     --- Professional ---                        K57.30, Diverticulosis of large intestine without                         perforation or abscess without bleeding                        K64.0, First degree hemorrhoids                        Z86.010, Personal history of colonic polyps CPT copyright 2022 American Medical Association. All rights reserved. The codes documented in this report are preliminary and upon coder review may  be revised to meet current compliance requirements. Stanton Kidney MD, MD 06/23/2023 10:12:47 AM This report has been signed electronically. Number of Addenda: 0 Note Initiated On: 06/23/2023 9:33 AM Scope Withdrawal Time: 0 hours 6 minutes 16 seconds  Total Procedure Duration: 0 hours 9 minutes 51 seconds  Estimated Blood Loss:  Estimated blood loss: none.      [facility]

## 2023-06-23 NOTE — Op Note (Signed)
[facility] Gastroenterology Patient Name: Patricia [name] Procedure Date: 06/23/2023 9:33 AM MRN: [id number] Account #: [id number] Date of Birth: [birth year] Admit Type: Outpatient Age: 67 Room: [facility] ENDO ROOM 4 Gender: Female Note Status: Finalized Instrument Name: Patton Salles Endoscope [id number] Procedure:             Upper GI endoscopy Indications:           Gastro-esophageal reflux disease, Portal venous                         hypertension Providers:             [place] Nearing. Brittish Bolinger MD, MD Medicines:             Propofol per Anesthesia Complications:         No immediate complications. Procedure:             Pre-Anesthesia Assessment:                        - The risks and benefits of the procedure and the                         sedation options and risks were discussed with the                         patient. All questions were answered and informed                         consent was obtained.                        - Patient identification and proposed procedure were                         verified prior to the procedure by the nurse. The                         procedure was verified in the procedure room.                        - ASA Grade Assessment: III - A patient with severe                         systemic disease.                        - After reviewing the risks and benefits, the patient                         was deemed in satisfactory condition to undergo the                         procedure.                        After obtaining informed consent, the endoscope was                         passed under direct vision. Throughout the procedure,  the patient's blood pressure, pulse, and oxygen                         saturations were monitored continuously. The Endoscope                         was introduced through the mouth, and advanced to the                         third part of duodenum. The upper GI endoscopy was                          accomplished without difficulty. The patient tolerated                         the procedure well. Findings:      The esophagus was normal.      The stomach was normal.      The examined duodenum was normal. Impression:            - Normal esophagus.                        - Normal stomach.                        - Normal examined duodenum.                        - No specimens collected. Recommendation:        - Repeat upper endoscopy in 3 years for screening                         purposes.                        - Proceed with colonoscopy Procedure Code(s):     --- Professional ---                        [phone], Esophagogastroduodenoscopy, flexible,                         transoral; diagnostic, including collection of                         specimen(s) by brushing or washing, when performed                         (separate procedure) Diagnosis Code(s):     --- Professional ---                        K21.9, Gastro-esophageal reflux disease without                         esophagitis CPT copyright 2022 American Medical Association. All rights reserved. The codes documented in this report are preliminary and upon coder review may  be revised to meet current compliance requirements. Stanton Kidney MD, MD 06/23/2023 9:55:48 AM This report has been signed electronically. Number of Addenda: 0 Note Initiated On: 06/23/2023 9:33 AM Estimated Blood Loss:  Estimated blood loss: none.  [facility]

## 2023-06-23 NOTE — Anesthesia Preprocedure Evaluation (Addendum)
Anesthesia Evaluation  Patient identified by MRN, date of birth, ID band Patient awake    Reviewed: Allergy & Precautions, H&P , NPO status , Patient's Chart, lab work & pertinent test results  History of Anesthesia Complications Negative for: history of anesthetic complications  Airway Mallampati: III  TM Distance: >3 FB     Dental  (+) Teeth Intact, Caps   Pulmonary sleep apnea and Continuous Positive Airway Pressure Ventilation , COPD, former smoker   breath sounds clear to auscultation       Cardiovascular hypertension, (-) angina (-) Past MI and (-) Cardiac Stents (-) dysrhythmias  Rhythm:regular Rate:Normal     Neuro/Psych  PSYCHIATRIC DISORDERS  Depression    negative neurological ROS     GI/Hepatic ,GERD  Controlled,,(+) Cirrhosis  ([facility])        Endo/Other  diabetes  Morbid obesity  Renal/GU negative Renal ROS  negative genitourinary   Musculoskeletal  (+) Arthritis ,    Abdominal  (+) + obese  Peds  Hematology negative hematology ROS (+)   Anesthesia Other Findings Past Medical History: No date: Allergy No date: Cancer [facility])     Comment:  cervical No date: Depression No date: Diabetes mellitus without complication (HCC) 2016: Gastritis No date: GERD (gastroesophageal reflux disease) No date: Hepatic disease No date: Hyperlipidemia No date: Hypertension No date: NASH (nonalcoholic steatohepatitis) No date: NASH (nonalcoholic steatohepatitis) No date: Sleep apnea No date: Spinal stenosis  Past Surgical History: No date: APPENDECTOMY 2006: BREAST BIOPSY; Right     Comment:  bx/clip-neg No date: CARPAL TUNNEL RELEASE; Right No date: CERVICAL DISCECTOMY No date: CHOLECYSTECTOMY 05/07/2018: COLONOSCOPY WITH PROPOFOL; N/A     Comment:  Procedure: COLONOSCOPY WITH PROPOFOL;  Surgeon: Scot Jun, MD;  Location: [facility] ENDOSCOPY;  Service:               Endoscopy;  Laterality:  N/A; No date: KNEE ARTHROSCOPY; Bilateral No date: LIVER BIOPSY No date: ROTATOR CUFF REPAIR; Right No date: TONSILLECTOMY No date: uterine ablation  BMI    Body Mass Index: 42.07 kg/m      Reproductive/Obstetrics negative OB ROS                             Anesthesia Physical Anesthesia Plan  ASA: III  Anesthesia Plan: General   Post-op Pain Management:    Induction: Intravenous  PONV Risk Score and Plan: Propofol infusion and TIVA  Airway Management Planned: Natural Airway  Additional Equipment:   Intra-op Plan:   Post-operative Plan:   Informed Consent: I have reviewed the patients History and Physical, chart, labs and discussed the procedure including the risks, benefits and alternatives for the proposed anesthesia with the patient or authorized representative who has indicated his/her understanding and acceptance.     Dental Advisory Given  Plan Discussed with: Anesthesiologist, CRNA and Surgeon  Anesthesia Plan Comments:         Anesthesia Quick Evaluation

## 2023-06-23 NOTE — Interval H&P Note (Signed)
History and Physical Interval Note:  06/23/2023 8:58 AM  Patricia [name]  has presented today for surgery, with the diagnosis of V12.72 (ICD-9-CM) - Z86.010 (ICD-10-CM) - History of adenomatous polyp of colon 571.5 (ICD-9-CM) - K74.69 (ICD-10-CM) - Other cirrhosis of liver (CMS/HHS-HCC) 530.81 (ICD-9-CM) - K21.9 (ICD-10-CM) - Gastroesophageal reflux disease, unspecified whether esophagitis present.  The various methods of treatment have been discussed with the patient and family. After consideration of risks, benefits and other options for treatment, the patient has consented to  Procedure(s): COLONOSCOPY WITH PROPOFOL (N/A) ESOPHAGOGASTRODUODENOSCOPY (EGD) WITH PROPOFOL (N/A) as a surgical intervention.  The patient's history has been reviewed, patient examined, no change in status, stable for surgery.  I have reviewed the patient's chart and labs.  Questions were answered to the patient's satisfaction.     [place], [place]

## 2023-06-23 NOTE — Transfer of Care (Signed)
Immediate Anesthesia Transfer of Care Note  Patient: Patricia [name]  Procedure(s) Performed: COLONOSCOPY WITH PROPOFOL ESOPHAGOGASTRODUODENOSCOPY (EGD) WITH PROPOFOL  Patient Location: Endoscopy Unit  Anesthesia Type:General  Level of Consciousness: sedated and drowsy  Airway & Oxygen Therapy: Patient Spontanous Breathing  Post-op Assessment: Report given to RN and Post -op Vital signs reviewed and stable  Post vital signs: Reviewed and stable  Last Vitals:  Vitals Value Taken Time  BP 106/69 06/23/23 1013  Temp 35.6 C 06/23/23 1013  Pulse 77 06/23/23 1013  Resp 17 06/23/23 1013  SpO2 93 % 06/23/23 1013    Last Pain:  Vitals:   06/23/23 1013  TempSrc: Temporal  PainSc: Asleep         Complications: No notable events documented.

## 2023-06-23 NOTE — Anesthesia Procedure Notes (Signed)
Procedure Name: MAC Date/Time: 06/23/2023 9:51 AM  Performed by: Ginger Carne, CRNAPre-anesthesia Checklist: Patient identified, Emergency Drugs available, Suction available, Patient being monitored and Timeout performed Patient Re-evaluated:Patient Re-evaluated prior to induction Oxygen Delivery Method: Nasal cannula Preoxygenation: Pre-oxygenation with 100% oxygen Induction Type: IV induction

## 2023-06-23 NOTE — Anesthesia Postprocedure Evaluation (Signed)
Anesthesia Post Note  Patient: Patricia [name]  Procedure(s) Performed: COLONOSCOPY WITH PROPOFOL ESOPHAGOGASTRODUODENOSCOPY (EGD) WITH PROPOFOL  Patient location during evaluation: Endoscopy Anesthesia Type: General Level of consciousness: awake and alert Pain management: pain level controlled Vital Signs Assessment: post-procedure vital signs reviewed and stable Respiratory status: spontaneous breathing, nonlabored ventilation and respiratory function stable Cardiovascular status: blood pressure returned to baseline and stable Postop Assessment: no apparent nausea or vomiting Anesthetic complications: no   No notable events documented.   Last Vitals:  Vitals:   06/23/23 1013 06/23/23 1032  BP: 106/69 (!) 141/70  Pulse: 77 72  Resp: 17 17  Temp: (!) 35.6 C   SpO2: 93% 98%    Last Pain:  Vitals:   06/23/23 1032  TempSrc:   PainSc: 0-No pain                 Foye Deer

## 2023-06-23 NOTE — H&P (Signed)
Outpatient short stay form Pre-procedure 06/23/2023 8:56 AM Patricia [name] K. Patricia [name], M.D.  Primary Physician: Patricia [name], M.D.  Reason for visit:  GERD, cirrhosis of the liver, personal history of adenomatous colon polyps  History of present illness:                            Patient presents for colonoscopy for a personal hx of colon polyps. The patient denies abdominal pain, abnormal weight loss or rectal bleeding.  05/07/2018: Colonoscopy: PH colonic polyps: Imp: 5 diminutive polyps. IH. Pathology: 2 hyperplastic, 1 TA, 1 SSA repeat 5 years  - 09/10/2020: EGD: Dr. Allegra [name]: cirrhosis rule out esophageal varices.  - Normal duodenal bulb and second portion of the duodenum. - Normal stomach. - Esophagogastric landmarks identified. - Ectopic gastric mucosa in the upper third of the esophagus. - Normal gastroesophageal junction. - No specimens collected.   -01/29/2023: [place] ABD complete: Screening cirrhosis: IMPRESSION:  1. Increased hepatic parenchymal echogenicity suggestive of  steatosis.  2. No cholelithiasis or sonographic evidence for acute  cholecystitis.     No current facility-administered medications for this encounter.  Medications Prior to Admission  Medication Sig Dispense Refill Last Dose   docusate sodium (COLACE) 100 MG capsule Take 100 mg by mouth 2 (two) times daily.      atorvastatin (LIPITOR) 20 MG tablet Take 1 tablet (20 mg total) by mouth at bedtime 90 tablet 1    cefdinir (OMNICEF) 300 MG capsule Take 1 capsule (300 mg total) by mouth 2 (two) times daily for 10 days 20 capsule 0    cefUROXime (CEFTIN) 500 MG tablet Take 1 tablet (500 mg total) by mouth 2 (two) times daily for 5 days 10 tablet 0    COVID-19 mRNA vaccine 2023-2024 (COMIRNATY) syringe Inject into the muscle. 0.3 mL 0    Dexlansoprazole 30 MG capsule DR Take 1 capsule (30 mg total) by mouth once daily 90 capsule 1    doxycycline (VIBRAMYCIN) 100 MG capsule Take 1 capsule (100 mg total) by mouth 2 (two)  times daily for 10 days 20 capsule 0    Dulaglutide (TRULICITY) 3 MG/0.5ML SOPN Inject 0.5 mLs (3 mg total) under the skin once a week 2 mL 5    fluticasone (FLONASE) 50 MCG/ACT nasal spray       furosemide (LASIX) 20 MG tablet Take 1 tablet (20 mg total) by mouth daily as needed for swelling. 90 tablet 1    furosemide (LASIX) 20 MG tablet Take 1 tablet (20 mg total) by mouth daily as needed for edema. 90 tablet 1    gabapentin (NEURONTIN) 400 MG capsule Take 1 capsule (400 mg total) by mouth at bedtime. 90 capsule 1    gabapentin (NEURONTIN) 600 MG tablet       Glucosamine-Chondroitin 250-200 MG TABS Take 1 tablet by mouth 2 (two) times daily.       glucose blood (FREESTYLE LITE) test strip Use to test blood sugar 3 times a day as needed 100 each 8    glucose blood test strip Test 3 times a day as instructed 100 strip 8    hydrocortisone (ANUSOL-HC) 25 MG suppository Insert 1 suppository (25 mg total) rectally 2 (two) times daily. 60 suppository 11    levocetirizine (XYZAL) 5 MG tablet Take by mouth.      lidocaine (LIDODERM) 5 % Place 1 patch onto the skin daily. Apply patch to the most painful area for up to 12  hours in a 24 hour period. 30 patch 0    metFORMIN (GLUCOPHAGE) 500 MG tablet Take 1 tablet by mouth daily with breakfast.      metFORMIN (GLUCOPHAGE) 500 MG tablet Take 1 tablet (500 mg total) by mouth daily with breakfast 90 tablet 1    methocarbamol (ROBAXIN) 750 MG tablet Take by mouth.      methocarbamol (ROBAXIN) 750 MG tablet Take 1 tablet (750 mg total) by mouth daily as needed. 90 tablet 0    naproxen (NAPROSYN) 500 MG tablet Take 1 tablet (500 mg total) by mouth 2 (two) times daily as needed for up to 10 days Take with food. 20 tablet 1    neomycin-polymyxin b-dexamethasone (MAXITROL) 3.5-10000-0.1 SUSP Instill 1 drop into affected eye every 4 hours while awake 5 mL 0    nirmatrelvir & ritonavir (PAXLOVID, 300/100,) 20 x 150 MG & 10 x 100MG  TBPK Take 2 (two) pink tablets (300  mg nirmatrelvir) with 1 (one) tablet (100 mg ritonavir) by mouth twice a day for 5 days. All 3 (three) tablets to be taken together every morning and evening. 30 each 0    nystatin (MYCOSTATIN/NYSTOP) powder Apply 1 Application topically 3 (three) times daily as needed. 60 g 11    ondansetron (ZOFRAN) 4 MG tablet 4 mg      predniSONE (DELTASONE) 20 MG tablet Take 40mg  x 3 days, 20mg  x 3 days, 10mg  x 3 days. 12 tablet 0    propranolol ER (INDERAL LA) 60 MG 24 hr capsule Take 1 capsule (60 mg total) by mouth daily. 90 capsule 1    RSV vaccine recomb adjuvanted (AREXVY) 120 MCG/0.5ML injection Inject 0.5 mLs (120 mcg total) into the muscle once for 1 dose 0.5 mL 0    Semaglutide, 1 MG/DOSE, (OZEMPIC, 1 MG/DOSE,) 4 MG/3ML SOPN Inject 0.75 mLs (1 mg total) subcutaneously once a week 3 mL 1    Semaglutide, 2 MG/DOSE, (OZEMPIC, 2 MG/DOSE,) 8 MG/3ML SOPN Inject 0.75 mLs (2 mg total) under the skin once a week 9 mL 1    Semaglutide, 2 MG/DOSE, (OZEMPIC, 2 MG/DOSE,) 8 MG/3ML SOPN Inject 0.75 mLs (2 mg total) subcutaneously once a week 9 mL 1    sodium fluoride (FLUORISHIELD) 1.1 % GEL dental gel Apply topically.      spironolactone (ALDACTONE) 50 MG tablet Take 1 tablet (50 mg total) by mouth once daily 90 tablet 1    zolpidem (AMBIEN) 5 MG tablet TAKE 1 TABLET BY MOUTH NIGHTLY AS NEEDED FOR SLEEP 30 tablet 3    zolpidem (AMBIEN) 5 MG tablet Take 1 tablet (5 mg total) by mouth at bedtime as needed for Sleep 30 tablet 3    zolpidem (AMBIEN) 5 MG tablet Take 1 tablet (5 mg total) by mouth at bedtime as needed for sleep. 30 tablet 2      Allergies  Allergen Reactions   Codeine Hives   Other Rash, Other (See Comments) and Itching    D-Vaseo, Carleene Mains Hydrate  Reaction: nausea and vomiting    Pantoprazole Rash and Other (See Comments)   Paroxetine Nausea And Vomiting   Promethazine Other (See Comments)    Reaction: "involuntary jerking" - fenergan   Amlodipine Other (See Comments)    Reaction: weakness,  sleep    Deconsal Ii [Phenylephrine-Guaifenesin]     Other reaction(s): Unknown    Pantoprazole Sodium Nausea And Vomiting   Paxil  [Paroxetine Hcl] Nausea And Vomiting   Pseudoephedrine Other (See Comments)    Reaction:  Dysesthesia    Omeprazole Rash   Povidone Iodine Rash     Past Medical History:  Diagnosis Date   Allergy    Cancer (HCC)    cervical   Current smoker 08/22/2016   Depression    Diabetes mellitus without complication (HCC)    Gastritis 2016   GERD (gastroesophageal reflux disease)    Hepatic disease    Hyperlipidemia    Hypertension    NASH (nonalcoholic steatohepatitis)    Sleep apnea    Spinal stenosis     Review of systems:  Otherwise negative.    Physical Exam  Gen: Alert, oriented. Appears stated age.  HEENT: [place]/AT. PERRLA. Lungs: CTA, no wheezes. CV: RR nl S1, S2. Abd: soft, benign, no masses. BS+ Ext: No edema. Pulses 2+    Planned procedures: Proceed with EGD and colonoscopy. The patient understands the nature of the planned procedure, indications, risks, alternatives and potential complications including but not limited to bleeding, infection, perforation, damage to internal organs and possible oversedation/side effects from anesthesia. The patient agrees and gives consent to proceed.  Please refer to procedure notes for findings, recommendations and patient disposition/instructions.     Patricia [name] K. Patricia [name], M.D. Gastroenterology 06/23/2023  8:56 AM

## 2023-06-24 ENCOUNTER — Encounter: Payer: Self-pay | Admitting: Internal Medicine

## 2023-07-06 ENCOUNTER — Other Ambulatory Visit: Payer: Self-pay

## 2023-07-14 DIAGNOSIS — S81802A Unspecified open wound, left lower leg, initial encounter: Secondary | ICD-10-CM | POA: Diagnosis not present

## 2023-07-14 DIAGNOSIS — L089 Local infection of the skin and subcutaneous tissue, unspecified: Secondary | ICD-10-CM | POA: Diagnosis not present

## 2023-07-23 DIAGNOSIS — I10 Essential (primary) hypertension: Secondary | ICD-10-CM | POA: Diagnosis not present

## 2023-07-23 DIAGNOSIS — E782 Mixed hyperlipidemia: Secondary | ICD-10-CM | POA: Diagnosis not present

## 2023-07-23 DIAGNOSIS — K625 Hemorrhage of anus and rectum: Secondary | ICD-10-CM | POA: Diagnosis not present

## 2023-07-23 DIAGNOSIS — E119 Type 2 diabetes mellitus without complications: Secondary | ICD-10-CM | POA: Diagnosis not present

## 2023-07-23 DIAGNOSIS — K649 Unspecified hemorrhoids: Secondary | ICD-10-CM | POA: Diagnosis not present

## 2023-07-30 ENCOUNTER — Other Ambulatory Visit: Payer: Self-pay

## 2023-07-30 DIAGNOSIS — F5104 Psychophysiologic insomnia: Secondary | ICD-10-CM | POA: Diagnosis not present

## 2023-07-30 DIAGNOSIS — G4733 Obstructive sleep apnea (adult) (pediatric): Secondary | ICD-10-CM | POA: Diagnosis not present

## 2023-07-30 DIAGNOSIS — E1149 Type 2 diabetes mellitus with other diabetic neurological complication: Secondary | ICD-10-CM | POA: Diagnosis not present

## 2023-07-30 DIAGNOSIS — E119 Type 2 diabetes mellitus without complications: Secondary | ICD-10-CM | POA: Diagnosis not present

## 2023-07-30 DIAGNOSIS — K7460 Unspecified cirrhosis of liver: Secondary | ICD-10-CM | POA: Diagnosis not present

## 2023-07-30 DIAGNOSIS — I10 Essential (primary) hypertension: Secondary | ICD-10-CM | POA: Diagnosis not present

## 2023-07-30 DIAGNOSIS — E782 Mixed hyperlipidemia: Secondary | ICD-10-CM | POA: Diagnosis not present

## 2023-07-30 MED ORDER — OZEMPIC (1 MG/DOSE) 4 MG/3ML ~~LOC~~ SOPN
1.0000 mg | PEN_INJECTOR | SUBCUTANEOUS | 3 refills | Status: DC
  Filled 2023-07-30 – 2023-08-03 (×2): qty 3, 28d supply, fill #0
  Filled 2023-08-17: qty 3, 28d supply, fill #1

## 2023-07-30 MED ORDER — GABAPENTIN 400 MG PO CAPS
400.0000 mg | ORAL_CAPSULE | Freq: Every day | ORAL | 1 refills | Status: AC
  Filled 2023-07-30: qty 90, 90d supply, fill #0

## 2023-07-30 MED ORDER — FUROSEMIDE 20 MG PO TABS
20.0000 mg | ORAL_TABLET | Freq: Every day | ORAL | 1 refills | Status: AC | PRN
  Filled 2023-07-30: qty 90, 90d supply, fill #0

## 2023-07-30 MED ORDER — PROPRANOLOL HCL ER 60 MG PO CP24
60.0000 mg | ORAL_CAPSULE | Freq: Every day | ORAL | 1 refills | Status: DC
  Filled 2023-07-30: qty 90, 90d supply, fill #0

## 2023-07-30 MED ORDER — ZOLPIDEM TARTRATE 5 MG PO TABS
5.0000 mg | ORAL_TABLET | Freq: Every evening | ORAL | 2 refills | Status: AC | PRN
  Filled 2023-07-30 – 2023-08-18 (×2): qty 30, 30d supply, fill #0

## 2023-08-03 ENCOUNTER — Other Ambulatory Visit: Payer: Self-pay

## 2023-08-14 DIAGNOSIS — G4733 Obstructive sleep apnea (adult) (pediatric): Secondary | ICD-10-CM | POA: Diagnosis not present

## 2023-08-14 DIAGNOSIS — K7460 Unspecified cirrhosis of liver: Secondary | ICD-10-CM | POA: Diagnosis not present

## 2023-08-17 ENCOUNTER — Other Ambulatory Visit: Payer: Self-pay

## 2023-08-18 ENCOUNTER — Other Ambulatory Visit: Payer: Self-pay

## 2023-08-21 ENCOUNTER — Other Ambulatory Visit: Payer: Self-pay

## 2023-08-21 MED ORDER — MOUNJARO 7.5 MG/0.5ML ~~LOC~~ SOAJ
7.5000 mg | SUBCUTANEOUS | 0 refills | Status: DC
  Filled 2023-08-21: qty 2, 28d supply, fill #0

## 2023-08-24 ENCOUNTER — Other Ambulatory Visit: Payer: Self-pay

## 2023-11-04 ENCOUNTER — Other Ambulatory Visit (HOSPITAL_COMMUNITY): Payer: Self-pay

## 2023-11-10 ENCOUNTER — Other Ambulatory Visit (HOSPITAL_COMMUNITY): Payer: Self-pay

## 2023-12-21 ENCOUNTER — Other Ambulatory Visit: Payer: Self-pay | Admitting: Physician Assistant

## 2023-12-21 DIAGNOSIS — Z1231 Encounter for screening mammogram for malignant neoplasm of breast: Secondary | ICD-10-CM

## 2024-01-08 ENCOUNTER — Other Ambulatory Visit (HOSPITAL_COMMUNITY): Payer: Self-pay

## 2024-01-08 MED ORDER — ZOLPIDEM TARTRATE 5 MG PO TABS
5.0000 mg | ORAL_TABLET | Freq: Every evening | ORAL | 0 refills | Status: DC | PRN
  Filled 2024-01-08: qty 30, 30d supply, fill #0

## 2024-01-12 ENCOUNTER — Ambulatory Visit
Admission: RE | Admit: 2024-01-12 | Discharge: 2024-01-12 | Disposition: A | Discharge status: Home / Self Care | Payer: Self-pay | Source: Ambulatory Visit | Attending: Physician Assistant | Admitting: Physician Assistant

## 2024-01-12 DIAGNOSIS — Z1231 Encounter for screening mammogram for malignant neoplasm of breast: Secondary | ICD-10-CM | POA: Insufficient documentation

## 2024-01-13 ENCOUNTER — Other Ambulatory Visit: Payer: Self-pay | Admitting: Physician Assistant

## 2024-01-13 DIAGNOSIS — N6325 Unspecified lump in the left breast, overlapping quadrants: Secondary | ICD-10-CM

## 2024-01-20 ENCOUNTER — Ambulatory Visit
Admission: RE | Admit: 2024-01-20 | Discharge: 2024-01-20 | Disposition: A | Discharge status: Home / Self Care | Source: Ambulatory Visit | Attending: Physician Assistant | Admitting: Physician Assistant

## 2024-01-20 DIAGNOSIS — N6325 Unspecified lump in the left breast, overlapping quadrants: Secondary | ICD-10-CM | POA: Insufficient documentation

## 2024-03-01 ENCOUNTER — Other Ambulatory Visit: Payer: Self-pay | Admitting: Physician Assistant

## 2024-03-01 DIAGNOSIS — Z87891 Personal history of nicotine dependence: Secondary | ICD-10-CM

## 2024-03-01 DIAGNOSIS — Z0000 Encounter for general adult medical examination without abnormal findings: Secondary | ICD-10-CM

## 2024-03-07 ENCOUNTER — Ambulatory Visit
Admission: RE | Admit: 2024-03-07 | Discharge: 2024-03-07 | Disposition: A | Discharge status: Home / Self Care | Source: Ambulatory Visit | Attending: Physician Assistant | Admitting: Physician Assistant

## 2024-03-07 DIAGNOSIS — Z0000 Encounter for general adult medical examination without abnormal findings: Secondary | ICD-10-CM | POA: Diagnosis present

## 2024-03-07 DIAGNOSIS — Z87891 Personal history of nicotine dependence: Secondary | ICD-10-CM | POA: Diagnosis present

## 2024-05-09 ENCOUNTER — Other Ambulatory Visit: Payer: Self-pay

## 2024-05-09 MED ORDER — FLUZONE HIGH-DOSE 0.5 ML IM SUSY
0.5000 mL | PREFILLED_SYRINGE | Freq: Once | INTRAMUSCULAR | 0 refills | Status: AC
  Filled 2024-05-09: qty 0.5, 1d supply, fill #0

## 2024-05-09 MED ORDER — COMIRNATY 30 MCG/0.3ML IM SUSY
0.3000 mL | PREFILLED_SYRINGE | Freq: Once | INTRAMUSCULAR | 0 refills | Status: AC
  Filled 2024-05-09: qty 0.3, 1d supply, fill #0

## 2024-05-09 MED ORDER — AZELASTINE HCL 0.1 % NA SOLN
1.0000 | Freq: Two times a day (BID) | NASAL | 2 refills | Status: AC
  Filled 2024-05-09: qty 30, 50d supply, fill #0

## 2024-05-09 NOTE — Progress Notes (Signed)
 Chief Complaint  Patient presents with  . Fall    Pt reports a fall in May, now having pain in her inferior pubic ramus. Pain worse when sitting for periods of time. Pain 3/10    Subjective  Patricia [name] is a 68 y.o. female who presents for Fall (Pt reports a fall in May, now having pain in her inferior pubic ramus. Pain worse when sitting for periods of time. Pain 3/10) HPI History of Present Illness Patricia [name] is a 68 year old female who presents with hip and shoulder pain following a fall.  Musculoskeletal pain following fall - Significant pain in the hip and shoulder following a fall - Hip pain localized to the buttock area, specifically the inferior ramus - Hip pain initially improved but has since flared up - Pain worsens with prolonged sitting, such as during doctor's visits - Persistent and bothersome shoulder pain  Allergic symptoms and respiratory complaints - Sneezing, coughing, and wheezing attributed to ragweed exposure while gardening - Uses ProAir  for symptom relief  Fatigue - Significant fatigue attributed to emotional and physical demands of caring for her daughter undergoing treatment for metastatic breast cancer - Frequent travel to assist her daughter contributes to fatigue  Adverse reaction to antidepressants - History of severe side effects, including projectile vomiting, with Paxil - Prefers to avoid antidepressants due to prior adverse experience  Review of Systems  Patient Active Problem List  Diagnosis  . Hypertension  . Fatty infiltration of liver  . Fibrosis of liver  . H/O sleep apnea  . Chronic insomnia  . Body mass index (BMI) of 40.0 to 44.9 in adult (CMS-HCC)  . Benign essential hypertension  . Degenerative disc disease, cervical  . Lumbar degenerative disc disease  . Gastritis  . Vitamin D deficiency  . Acute left-sided low back pain with bilateral sciatica  . Type 2 diabetes mellitus without complication, without long-term  current use of insulin  (CMS/HHS-HCC)  . Essential hypertension  . Hepatic steatosis  . Current smoker  . Primary osteoarthritis involving multiple joints  . Malignant melanoma of face (CMS/HHS-HCC)  . Liver cirrhosis secondary to NASH (CMS/HHS-HCC)  . Neuropathy  . Spinal stenosis of lumbar region with radiculopathy         Objective  Vitals:   05/09/24 1348  BP: (!) 142/74  Pulse: 79  SpO2: 97%  Weight: 91.4 kg (201 lb 6.4 oz)  Height: 157.5 cm (5' 2)  PainSc:   3   Body mass index is 36.84 kg/m.  Home Vitals:     Physical Exam Physical Exam Wt Readings from Last 3 Encounters:  05/09/24 91.4 kg (201 lb 6.4 oz)  02/23/24 89.9 kg (198 lb 3.2 oz)  07/30/23 (!) 108.8 kg (239 lb 12.8 oz)       Constitutional: alert, in NAD, and communicates well Respiratory: clear to auscultation, without rales or wheezes  Cardiovascular: regular rate and rhythm and without murmurs, rubs or gallops Lower extremities: no lower extremity edema Neurological: sensorimotor grossly intact  Results   No visits with results within 10 Day(s) from this visit.  Latest known visit with results is:  Appointment on 02/16/2024  Component Date Value Ref Range Status  . WBC (White Blood Cell Count) 02/16/2024 12.0 (H)  4.1 - 10.2 10^3/uL Final  . RBC (Red Blood Cell Count) 02/16/2024 4.73  4.04 - 5.48 10^6/uL Final  . Hemoglobin 02/16/2024 14.5  12.0 - 15.0 gm/dL Final  . Hematocrit 92/98/7974 44.3  35.0 - 47.0 %  Final  . MCV (Mean Corpuscular Volume) 02/16/2024 93.7  80.0 - 100.0 fl Final  . MCH (Mean Corpuscular Hemoglobin) 02/16/2024 30.7  27.0 - 31.2 pg Final  . MCHC (Mean Corpuscular Hemoglobin * 02/16/2024 32.7  32.0 - 36.0 gm/dL Final  . Platelet Count 02/16/2024 200  150 - 450 10^3/uL Final  . RDW-CV (Red Cell Distribution Widt* 02/16/2024 13.0  11.6 - 14.8 % Final  . MPV (Mean Platelet Volume) 02/16/2024 11.0  9.4 - 12.4 fl Final  . Neutrophils 02/16/2024 7.44  1.50 - 7.80  10^3/uL Final  . Lymphocytes 02/16/2024 3.12  1.00 - 3.60 10^3/uL Final  . Monocytes 02/16/2024 1.15  0.00 - 1.50 10^3/uL Final  . Eosinophils 02/16/2024 0.19  0.00 - 0.55 10^3/uL Final  . Basophils 02/16/2024 0.05  0.00 - 0.09 10^3/uL Final  . Neutrophil % 02/16/2024 62.1  32.0 - 70.0 % Final  . Lymphocyte % 02/16/2024 26.0  10.0 - 50.0 % Final  . Monocyte % 02/16/2024 9.6  4.0 - 13.0 % Final  . Eosinophil % 02/16/2024 1.6  1.0 - 5.0 % Final  . Basophil% 02/16/2024 0.4  0.0 - 2.0 % Final  . Immature Granulocyte % 02/16/2024 0.3  <=0.7 % Final  . Immature Granulocyte Count 02/16/2024 0.03  <=0.06 10^3/L Final  . Glucose 02/16/2024 103  70 - 110 mg/dL Final  . Sodium 92/98/7974 140  136 - 145 mmol/L Final  . Potassium 02/16/2024 4.6  3.6 - 5.1 mmol/L Final  . Chloride 02/16/2024 102  97 - 109 mmol/L Final  . Carbon Dioxide (CO2) 02/16/2024 30.9  22.0 - 32.0 mmol/L Final  . Urea Nitrogen (BUN) 02/16/2024 12  7 - 25 mg/dL Final  . Creatinine 92/98/7974 0.8  0.6 - 1.1 mg/dL Final  . Glomerular Filtration Rate (eGFR) 02/16/2024 81  >60 mL/min/1.73sq m Final  . Calcium  02/16/2024 9.7  8.7 - 10.3 mg/dL Final  . AST  92/98/7974 20  8 - 39 U/L Final  . ALT  02/16/2024 17  5 - 38 U/L Final  . Alk Phos (alkaline Phosphatase) 02/16/2024 108 (H)  34 - 104 U/L Final  . Albumin 02/16/2024 4.1  3.5 - 4.8 g/dL Final  . Bilirubin, Total 02/16/2024 0.8  0.3 - 1.2 mg/dL Final  . Protein, Total 02/16/2024 6.1  6.1 - 7.9 g/dL Final  . A/G Ratio 92/98/7974 2.1  1.0 - 5.0 gm/dL Final  . Cholesterol, Total 02/16/2024 122  100 - 200 mg/dL Final  . Triglyceride 92/98/7974 161  35 - 199 mg/dL Final  . HDL (High Density Lipoprotein) Cho* 02/16/2024 34.7 (L)  35.0 - 85.0 mg/dL Final  . LDL Calculated 02/16/2024 55  0 - 130 mg/dL Final  . VLDL Cholesterol 02/16/2024 32  mg/dL Final  . Cholesterol/HDL Ratio 02/16/2024 3.5   Final  . Color 02/16/2024 Yellow  Colorless, Straw, Light Yellow, Yellow, Dark Yellow  Final  . Clarity 02/16/2024 Clear  Clear Final  . Specific Gravity 02/16/2024 1.024  1.005 - 1.030 Final  . pH, Urine 02/16/2024 5.5  5.0 - 8.0 Final  . Protein, Urinalysis 02/16/2024 Trace (!)  Negative mg/dL Final  . Glucose, Urinalysis 02/16/2024 Negative  Negative mg/dL Final  . Ketones, Urinalysis 02/16/2024 Negative  Negative mg/dL Final  . Blood, Urinalysis 02/16/2024 Negative  Negative Final  . Nitrite, Urinalysis 02/16/2024 Negative  Negative Final  . Leukocyte Esterase, Urinalysis 02/16/2024 Negative  Negative Final  . Bilirubin, Urinalysis 02/16/2024 Negative  Negative Final  . Urobilinogen, Urinalysis 02/16/2024  0.2  0.2 - 1.0 mg/dL Final  . Mucous, Urine 02/16/2024 PRESENT (!)  None Seen Final  . WBC, UA 02/16/2024 3  <=5 /hpf Final  . Red Blood Cells, Urinalysis 02/16/2024 1  <=3 /hpf Final  . Bacteria, Urinalysis 02/16/2024 0-5  0 - 5 /hpf Final  . Squamous Epithelial Cells, Urinaly* 02/16/2024 3  /hpf Final  . Hemoglobin A1C 02/16/2024 5.3  4.2 - 5.6 % Final  . Average Blood Glucose (Calc) 02/16/2024 105  mg/dL Final       Assessment/Plan:   Assessment & Plan Chronic right hip and buttock pain Pain likely exacerbated by prolonged sitting and recent fall, localized to inferior ramus area. - Order pelvis x-ray to evaluate for fractures or abnormalities.  Right shoulder pain Persistent pain possibly due to bursitis.  Fatigue Likely related to emotional and physical stress from caregiving and travel. Avoid pharmacological intervention due to past adverse reaction to Paxil. - Encourage self-care, including healthy eating and regular exercise.  Allergic rhinitis Symptoms likely due to ragweed exposure. - Reorder ProAir  inhaler. -Trial Astepro  1 spr each side bid.   Diagnoses and all orders for this visit:  Left-sided ischial pain -     X-ray pelvis 3 plus views; Future  Moderate persistent extrinsic asthma without complication (HHS-HCC)  Essential  hypertension  Other orders -     azelastine  (ASTEPRO ) 0.15 % (205.5 mcg) nasal spray; Place 1 spray into both nostrils 2 (two) times daily -     ibuprofen  (MOTRIN ) 600 MG tablet; Take 1 tablet (600 mg total) by mouth every 6 (six) hours as needed for Pain    This visit was coded based on medical decision making (MDM).           Future Appointments     Date/Time Provider Department Center Visit Type   08/24/2024 8:00 AM [facility] C LAB   08/31/2024 10:45 AM Marikay, Eva Hausen, PA [facility] MARYL BROCKS [facility] OFFICE VISIT       Patient Instructions  VISIT SUMMARY: Today, you were seen for hip and shoulder pain following a fall, allergic symptoms, and fatigue. We discussed your symptoms and created a plan to address each issue.  YOUR PLAN: -CHRONIC RIGHT HIP AND BUTTOCK PAIN: Your hip pain is likely worsened by prolonged sitting and the recent fall. We will order a pelvis x-ray to check for any fractures or abnormalities.  -RIGHT SHOULDER PAIN: Your shoulder pain may be due to bursitis, which is inflammation of the fluid-filled sacs that cushion your joints. We will monitor your symptoms and consider further treatment if necessary.  -FATIGUE: Your fatigue is likely due to the emotional and physical stress of caring for your daughter and frequent travel. We recommend focusing on self-care, including healthy eating and regular exercise.  -ALLERGIC RHINITIS: Your sneezing, coughing, and wheezing are likely due to ragweed exposure. We have reordered your ProAir  inhaler to help manage these symptoms.  INSTRUCTIONS: Please schedule a pelvis x-ray to evaluate your hip pain. Continue using your ProAir  inhaler as needed for allergic symptoms. Focus on self-care, including healthy eating and regular exercise, to help manage your fatigue. Follow up with us  if your symptoms persist or worsen.  AI was used to generate this content from the visit  notes.  An after visit summary was provided for the patient either in written format (printed) or through [facility].  This note has been created using automated tools and reviewed for accuracy by  MIRIAM KLEM MCLAUGHLIN.  Eva Crimes, PA-C

## 2024-05-12 ENCOUNTER — Emergency Department

## 2024-05-12 ENCOUNTER — Observation Stay
Admission: EM | Admit: 2024-05-12 | Discharge: 2024-05-14 | Disposition: A | Discharge status: Home Health | Attending: Internal Medicine | Admitting: Internal Medicine

## 2024-05-12 ENCOUNTER — Other Ambulatory Visit: Payer: Self-pay

## 2024-05-12 DIAGNOSIS — M5416 Radiculopathy, lumbar region: Secondary | ICD-10-CM | POA: Insufficient documentation

## 2024-05-12 DIAGNOSIS — E119 Type 2 diabetes mellitus without complications: Secondary | ICD-10-CM | POA: Insufficient documentation

## 2024-05-12 DIAGNOSIS — M48061 Spinal stenosis, lumbar region without neurogenic claudication: Secondary | ICD-10-CM | POA: Diagnosis not present

## 2024-05-12 DIAGNOSIS — E66813 Obesity, class 3: Secondary | ICD-10-CM | POA: Diagnosis not present

## 2024-05-12 DIAGNOSIS — K7460 Unspecified cirrhosis of liver: Secondary | ICD-10-CM | POA: Diagnosis present

## 2024-05-12 DIAGNOSIS — J449 Chronic obstructive pulmonary disease, unspecified: Secondary | ICD-10-CM | POA: Insufficient documentation

## 2024-05-12 DIAGNOSIS — Z6836 Body mass index (BMI) 36.0-36.9, adult: Secondary | ICD-10-CM | POA: Diagnosis not present

## 2024-05-12 DIAGNOSIS — G4733 Obstructive sleep apnea (adult) (pediatric): Secondary | ICD-10-CM | POA: Diagnosis not present

## 2024-05-12 DIAGNOSIS — I959 Hypotension, unspecified: Secondary | ICD-10-CM

## 2024-05-12 DIAGNOSIS — I10 Essential (primary) hypertension: Secondary | ICD-10-CM | POA: Insufficient documentation

## 2024-05-12 DIAGNOSIS — S82002A Unspecified fracture of left patella, initial encounter for closed fracture: Secondary | ICD-10-CM

## 2024-05-12 DIAGNOSIS — K7581 Nonalcoholic steatohepatitis (NASH): Secondary | ICD-10-CM | POA: Insufficient documentation

## 2024-05-12 DIAGNOSIS — S82042A Displaced comminuted fracture of left patella, initial encounter for closed fracture: Secondary | ICD-10-CM | POA: Diagnosis present

## 2024-05-12 DIAGNOSIS — Z01818 Encounter for other preprocedural examination: Secondary | ICD-10-CM

## 2024-05-12 DIAGNOSIS — Z87891 Personal history of nicotine dependence: Secondary | ICD-10-CM | POA: Diagnosis not present

## 2024-05-12 DIAGNOSIS — W19XXXA Unspecified fall, initial encounter: Principal | ICD-10-CM | POA: Insufficient documentation

## 2024-05-12 DIAGNOSIS — Z79899 Other long term (current) drug therapy: Secondary | ICD-10-CM | POA: Insufficient documentation

## 2024-05-12 DIAGNOSIS — S82092A Other fracture of left patella, initial encounter for closed fracture: Secondary | ICD-10-CM | POA: Diagnosis not present

## 2024-05-12 DIAGNOSIS — E669 Obesity, unspecified: Secondary | ICD-10-CM | POA: Insufficient documentation

## 2024-05-12 LAB — COMPREHENSIVE METABOLIC PANEL WITH GFR
ALT: 19 U/L (ref 0–44)
AST: 23 U/L (ref 15–41)
Albumin: 3.7 g/dL (ref 3.5–5.0)
Alkaline Phosphatase: 112 U/L (ref 38–126)
Anion gap: 10 (ref 5–15)
BUN: 16 mg/dL (ref 8–23)
CO2: 25 mmol/L (ref 22–32)
Calcium: 9.1 mg/dL (ref 8.9–10.3)
Chloride: 106 mmol/L (ref 98–111)
Creatinine, Ser: 0.96 mg/dL (ref 0.44–1.00)
GFR, Estimated: >60 mL/min (ref >60)
Glucose, Bld: 96 mg/dL (ref 70–99)
Potassium: 4.4 mmol/L (ref 3.5–5.1)
Sodium: 141 mmol/L (ref 135–145)
Total Bilirubin: 0.7 mg/dL (ref 0.0–1.2)
Total Protein: 6.6 g/dL (ref 6.5–8.1)

## 2024-05-12 LAB — CBC
HCT: 41.8 % (ref 36.0–46.0)
Hemoglobin: 14 g/dL (ref 12.0–15.0)
MCH: 31.5 pg (ref 26.0–34.0)
MCHC: 33.5 g/dL (ref 30.0–36.0)
MCV: 93.9 fL (ref 80.0–100.0)
Platelets: 147 K/uL — ABNORMAL LOW (ref 150–400)
RBC: 4.45 MIL/uL (ref 3.87–5.11)
RDW: 13.1 % (ref 11.5–15.5)
WBC: 10.8 K/uL — ABNORMAL HIGH (ref 4.0–10.5)
nRBC: 0 % (ref 0.0–0.2)

## 2024-05-12 LAB — GLUCOSE, CAPILLARY: Glucose-Capillary: 84 mg/dL (ref 70–99)

## 2024-05-12 MED ORDER — METHOCARBAMOL 500 MG PO TABS
750.0000 mg | ORAL_TABLET | Freq: Every day | ORAL | Status: DC | PRN
Start: 2024-05-12 — End: 2024-05-14
  Administered 2024-05-13 – 2024-05-14 (×2): 750 mg via ORAL
  Filled 2024-05-12 (×2): qty 2

## 2024-05-12 MED ORDER — KETOROLAC TROMETHAMINE 15 MG/ML IJ SOLN
15.0000 mg | Freq: Four times a day (QID) | INTRAMUSCULAR | Status: DC | PRN
  Administered 2024-05-13: 15 mg via INTRAVENOUS
  Filled 2024-05-12: qty 1

## 2024-05-12 MED ORDER — ACETAMINOPHEN 650 MG RE SUPP: 650.0000 mg | Freq: Four times a day (QID) | RECTAL | Status: DC | PRN

## 2024-05-12 MED ORDER — MORPHINE SULFATE (PF) 2 MG/ML IV SOLN
2.0000 mg | INTRAVENOUS | Status: DC | PRN
  Administered 2024-05-13: 2 mg via INTRAVENOUS
  Filled 2024-05-12: qty 1

## 2024-05-12 MED ORDER — OXYCODONE HCL 5 MG PO TABS: 5.0000 mg | ORAL_TABLET | Freq: Once | ORAL | Status: DC

## 2024-05-12 MED ORDER — HYDROMORPHONE HCL 1 MG/ML IJ SOLN
1.0000 mg | Freq: Once | INTRAMUSCULAR | Status: AC
  Administered 2024-05-12: 1 mg via INTRAVENOUS
  Filled 2024-05-12: qty 1

## 2024-05-12 MED ORDER — ACETAMINOPHEN 325 MG PO TABS: 650.0000 mg | ORAL_TABLET | Freq: Four times a day (QID) | ORAL | Status: DC | PRN

## 2024-05-12 MED ORDER — HYDROCODONE-ACETAMINOPHEN 5-325 MG PO TABS
1.0000 | ORAL_TABLET | ORAL | Status: DC | PRN
  Administered 2024-05-12: 2 via ORAL
  Administered 2024-05-13: 1 via ORAL
  Administered 2024-05-13: 2 via ORAL
  Filled 2024-05-12: qty 2
  Filled 2024-05-12: qty 1
  Filled 2024-05-12: qty 2

## 2024-05-12 MED ORDER — ONDANSETRON HCL 4 MG PO TABS: 4.0000 mg | ORAL_TABLET | Freq: Four times a day (QID) | ORAL | Status: DC | PRN

## 2024-05-12 MED ORDER — ONDANSETRON HCL 4 MG/2ML IJ SOLN
4.0000 mg | Freq: Four times a day (QID) | INTRAMUSCULAR | Status: DC | PRN
  Administered 2024-05-13: 4 mg via INTRAVENOUS
  Filled 2024-05-12: qty 2

## 2024-05-12 MED ORDER — INSULIN ASPART 100 UNIT/ML IJ SOLN: 0.0000 [IU] | INTRAMUSCULAR | Status: DC

## 2024-05-12 MED ORDER — CEFAZOLIN SODIUM-DEXTROSE 2-4 GM/100ML-% IV SOLN
2.0000 g | INTRAVENOUS | Status: AC
  Administered 2024-05-13: 2 g via INTRAVENOUS
  Filled 2024-05-12: qty 100

## 2024-05-12 NOTE — ED Provider Notes (Signed)
 Seen with PA, left knee patellar swelling suspicious for contusion versus fracture, x-rays pending   Patricia Charleston, MD 05/12/24 [phone]

## 2024-05-12 NOTE — Assessment & Plan Note (Addendum)
 Patient's hemoglobin A1c is low at 4.9.  Discontinue metformin .  Patient will speak with her medical doctor about Mounjaro .  She states she takes it for diabetes not weight loss.  Episode of hypoglycemia secondary to prolonged not eating yesterday with being n.p.o. and having a late surgery.

## 2024-05-12 NOTE — Assessment & Plan Note (Signed)
CPAP nightly if desired °

## 2024-05-12 NOTE — H&P (Signed)
 History and Physical    Patient: Patricia [name] FMW:[id number] DOB: Dec 27, 1955 DOA: 05/12/2024 DOS: the patient was seen and examined on 05/12/2024 PCP: Marikay Eva POUR, PA  Patient coming from: Home  Chief Complaint:  Chief Complaint  Patient presents with   Fall    HPI: Patricia [name] is a 68 y.o. female with medical history significant for HTN,, DM, sleep apnea, morbid obesity,  NASH cirrhosis followed at [facility] liver clinic, osteoarthritis, spinal stenosis, being admitted with a displaced comminuted fracture of the left patella, going to the OR with orthopedics, Dr. Edie in the AM.  The fall was accidental ,when she slipped on water in her kitchen, falling onto her left knee.  She was previously in her usual state of health and denied preceding lightheadedness, visual disturbance or headache, palpitations, chest pain or shortness of breath.  Does mention that she had a fall a few months ago on the stairs. In the ED vitals within normal limits.  Labs which included CBC and CMP were normal except for slightly elevated WBC of 10.8. Admission requested     Review of Systems: As mentioned in the history of present illness. All other systems reviewed and are negative.  Past Medical History:  Diagnosis Date   Allergy    Cancer (HCC)    cervical   Current smoker 08/22/2016   Depression    Diabetes mellitus without complication (HCC)    Gastritis 2016   GERD (gastroesophageal reflux disease)    Hepatic disease    Hyperlipidemia    Hypertension    NASH (nonalcoholic steatohepatitis)    Sleep apnea    Spinal stenosis    Past Surgical History:  Procedure Laterality Date   APPENDECTOMY     BREAST BIOPSY Right 2006   bx/clip-neg   CARPAL TUNNEL RELEASE Right    CERVICAL CONE BIOPSY     CERVICAL DISCECTOMY     CHOLECYSTECTOMY     COLONOSCOPY WITH PROPOFOL  N/A 05/07/2018   Procedure: COLONOSCOPY WITH PROPOFOL ;  Surgeon: Viktoria Lamar DASEN, MD;  Location: [facility] ENDOSCOPY;   Service: Endoscopy;  Laterality: N/A;   COLONOSCOPY WITH PROPOFOL  N/A 06/23/2023   Procedure: COLONOSCOPY WITH PROPOFOL ;  Surgeon: Toledo, Ladell POUR, MD;  Location: [facility] ENDOSCOPY;  Service: Gastroenterology;  Laterality: N/A;   ESOPHAGOGASTRODUODENOSCOPY (EGD) WITH PROPOFOL  N/A 09/10/2020   Procedure: ESOPHAGOGASTRODUODENOSCOPY (EGD) WITH PROPOFOL ;  Surgeon: Unk Corinn Skiff, MD;  Location: [facility] ENDOSCOPY;  Service: Gastroenterology;  Laterality: N/A;   ESOPHAGOGASTRODUODENOSCOPY (EGD) WITH PROPOFOL  N/A 06/23/2023   Procedure: ESOPHAGOGASTRODUODENOSCOPY (EGD) WITH PROPOFOL ;  Surgeon: Toledo, Ladell POUR, MD;  Location: [facility] ENDOSCOPY;  Service: Gastroenterology;  Laterality: N/A;   KNEE ARTHROSCOPY Bilateral    LIVER BIOPSY     MOHS SURGERY     face   ROTATOR CUFF REPAIR Right    TONSILLECTOMY     uterine ablation     Social History:  reports that she quit smoking about 3 years ago. Her smoking use included cigarettes. She started smoking about 33 years ago. She has a 30 pack-year smoking history. She has never used smokeless tobacco. She reports that she does not currently use alcohol. She reports that she does not use drugs.  Allergies  Allergen Reactions   Codeine Hives   Other Rash, Other (See Comments) and Itching    D-Vaseo, Christine Hydrate  Reaction: nausea and vomiting    Pantoprazole Rash and Other (See Comments)   Paroxetine Nausea And Vomiting   Promethazine Other (See Comments)  Reaction: involuntary jerking - fenergan   Amlodipine Other (See Comments)    Reaction: weakness, sleep    Deconsal Ii [Phenylephrine -Guaifenesin]     Other reaction(s): Unknown    Pantoprazole Sodium Nausea And Vomiting   Paxil  [Paroxetine Hcl] Nausea And Vomiting   Pseudoephedrine Other (See Comments)    Reaction: Dysesthesia    Omeprazole Rash   Povidone Iodine Rash    Family History  Problem Relation Age of Onset   Diabetes Father    Diabetes Sister    Diabetes Paternal  Grandmother    Heart disease Mother    Hypertension Mother    Hyperlipidemia Mother    Bipolar disorder Daughter    Breast cancer Neg Hx     Prior to Admission medications   Medication Sig Start Date End Date Taking? Authorizing Provider  atorvastatin  (LIPITOR) 20 MG tablet Take 1 tablet (20 mg total) by mouth at bedtime 05/20/23     azelastine  (ASTELIN ) 0.1 % nasal spray Place 1 spray into both nostrils 2 (two) times daily. 05/09/24     cefdinir  (OMNICEF ) 300 MG capsule Take 1 capsule (300 mg total) by mouth 2 (two) times daily for 10 days 03/17/22     cefUROXime  (CEFTIN ) 500 MG tablet Take 1 tablet (500 mg total) by mouth 2 (two) times daily for 5 days 12/13/21     COVID-19 mRNA vaccine 2023-2024 (COMIRNATY ) syringe Inject into the muscle. 08/27/22   Luiz Channel, MD  Dexlansoprazole  30 MG capsule DR Take 1 capsule (30 mg total) by mouth once daily 01/02/22     docusate sodium  (COLACE) 100 MG capsule Take 100 mg by mouth 2 (two) times daily.    [provider]  doxycycline  (VIBRAMYCIN ) 100 MG capsule Take 1 capsule (100 mg total) by mouth 2 (two) times daily for 10 days 05/13/22     Dulaglutide  (TRULICITY ) 3 MG/0.5ML SOPN Inject 0.5 mLs (3 mg total) under the skin once a week 11/17/21     fluticasone (FLONASE) 50 MCG/ACT nasal spray  08/04/19   [provider]  furosemide  (LASIX ) 20 MG tablet Take 1 tablet (20 mg total) by mouth daily as needed for swelling. Patient not taking: Reported on 06/23/2023 08/19/22     furosemide  (LASIX ) 20 MG tablet Take 1 tablet (20 mg total) by mouth daily as needed. 07/30/23     gabapentin  (NEURONTIN ) 400 MG capsule Take 1 capsule (400 mg total) by mouth at bedtime. 07/30/23     gabapentin  (NEURONTIN ) 600 MG tablet  08/04/19   [provider]  Glucosamine-Chondroitin 250-200 MG TABS Take 1 tablet by mouth 2 (two) times daily.     [provider]  glucose blood (FREESTYLE LITE) test strip Use to test blood sugar 3 times a day as  needed 05/20/23     glucose blood test strip Test 3 times a day as instructed 11/27/20   Marikay Eva POUR, PA  hydrocortisone  (ANUSOL -HC) 25 MG suppository Insert 1 suppository (25 mg total) rectally 2 (two) times daily. 02/12/23     levocetirizine (XYZAL) 5 MG tablet Take by mouth. 10/18/18   [provider]  lidocaine  (LIDODERM ) 5 % Place 1 patch onto the skin daily. Apply patch to the most painful area for up to 12 hours in a 24 hour period. 01/27/23     metFORMIN  (GLUCOPHAGE ) 500 MG tablet Take 1 tablet by mouth daily with breakfast. 11/14/21 11/14/22  [provider]  metFORMIN  (GLUCOPHAGE ) 500 MG tablet Take 1 tablet (500 mg  total) by mouth daily with breakfast 05/20/23     methocarbamol  (ROBAXIN ) 750 MG tablet Take by mouth. 08/04/19   [provider]  methocarbamol  (ROBAXIN ) 750 MG tablet Take 1 tablet (750 mg total) by mouth daily as needed. 05/20/23     naproxen  (NAPROSYN ) 500 MG tablet Take 1 tablet (500 mg total) by mouth 2 (two) times daily as needed for up to 10 days Take with food. 03/17/22     neomycin -polymyxin b-dexamethasone  (MAXITROL ) 3.5-10000-0.1 SUSP Instill 1 drop into affected eye every 4 hours while awake 01/28/22     nirmatrelvir  & ritonavir  (PAXLOVID , 300/100,) 20 x 150 MG & 10 x 100MG  TBPK Take 2 (two) pink tablets (300 mg nirmatrelvir ) with 1 (one) tablet (100 mg ritonavir ) by mouth twice a day for 5 days. All 3 (three) tablets to be taken together every morning and evening. 04/30/22     nystatin  (MYCOSTATIN /NYSTOP ) powder Apply 1 Application topically 3 (three) times daily as needed. 12/24/22     ondansetron  (ZOFRAN ) 4 MG tablet 4 mg 10/02/20   [provider]  predniSONE  (DELTASONE ) 20 MG tablet Take 40mg  x 3 days, 20mg  x 3 days, 10mg  x 3 days. 05/13/22     propranolol  ER (INDERAL  LA) 60 MG 24 hr capsule Take 1 capsule (60 mg total) by mouth daily. 07/30/23     RSV vaccine recomb adjuvanted (AREXVY ) 120 MCG/0.5ML injection Inject 0.5 mLs (120 mcg  total) into the muscle once for 1 dose 07/29/22     Semaglutide , 1 MG/DOSE, (OZEMPIC , 1 MG/DOSE,) 4 MG/3ML SOPN Inject 0.75 mLs (1 mg total) subcutaneously once a week Patient not taking: Reported on 06/23/2023 12/19/21     Semaglutide , 1 MG/DOSE, (OZEMPIC , 1 MG/DOSE,) 4 MG/3ML SOPN Inject 1 mg into the skin once a week. 07/30/23     Semaglutide , 2 MG/DOSE, (OZEMPIC , 2 MG/DOSE,) 8 MG/3ML SOPN Inject 0.75 mLs (2 mg total) under the skin once a week Patient not taking: Reported on 06/23/2023 03/24/22     sodium fluoride (FLUORISHIELD) 1.1 % GEL dental gel Apply topically. 12/12/16   [provider]  spironolactone  (ALDACTONE ) 50 MG tablet Take 1 tablet (50 mg total) by mouth once daily 05/20/23     tirzepatide  (MOUNJARO ) 7.5 MG/0.5ML Pen Inject 0.5 mLs (7.5 mg total) subcutaneously once a week 08/21/23     zolpidem  (AMBIEN ) 5 MG tablet TAKE 1 TABLET BY MOUTH NIGHTLY AS NEEDED FOR SLEEP 11/14/21     zolpidem  (AMBIEN ) 5 MG tablet Take 1 tablet (5 mg total) by mouth at bedtime as needed for Sleep 11/12/22     zolpidem  (AMBIEN ) 5 MG tablet Take 1 tablet (5 mg total) by mouth at bedtime as needed. 07/30/23     zolpidem  (AMBIEN ) 5 MG tablet Take 1 tablet (5 mg total) by mouth at bedtime as needed for sleep 01/08/24       Physical Exam: Vitals:   05/12/24 1937 05/12/24 1940 05/12/24 1941  BP:  130/65   Pulse:  71   Resp:  16   Temp:   98.3 F (36.8 C)  TempSrc:   Oral  SpO2: 97% 98%   Weight:   91.2 kg  Height:   5' 2 (1.575 m)   Physical Exam Vitals and nursing note reviewed.  Constitutional:      General: She is not in acute distress.    Comments: Patient in pain, but unable to straighten the leg to tolerate the knee immobilizer ordered from the ED  HENT:  Head: Normocephalic and atraumatic.  Cardiovascular:     Rate and Rhythm: Normal rate and regular rhythm.     Heart sounds: Normal heart sounds.  Pulmonary:     Effort: Pulmonary effort is normal.     Breath sounds: Normal breath  sounds.  Abdominal:     Palpations: Abdomen is soft.     Tenderness: There is no abdominal tenderness.  Neurological:     Mental Status: Mental status is at baseline.     Labs on Admission: I have personally reviewed following labs and imaging studies  CBC: Recent Labs  Lab 05/12/24 2037  WBC 10.8*  HGB 14.0  HCT 41.8  MCV 93.9  PLT 147*   Basic Metabolic Panel: Recent Labs  Lab 05/12/24 2037  NA 141  K 4.4  CL 106  CO2 25  GLUCOSE 96  BUN 16  CREATININE 0.96  CALCIUM  9.1   GFR: Estimated Creatinine Clearance: 60.5 mL/min (by C-G formula based on SCr of 0.96 mg/dL). Liver Function Tests: Recent Labs  Lab 05/12/24 2037  AST 23  ALT 19  ALKPHOS 112  BILITOT 0.7  PROT 6.6  ALBUMIN 3.7   No results for input(s): LIPASE, AMYLASE in the last 168 hours. No results for input(s): AMMONIA in the last 168 hours. Coagulation Profile: No results for input(s): INR, PROTIME in the last 168 hours. Cardiac Enzymes: No results for input(s): CKTOTAL, CKMB, CKMBINDEX, TROPONINI in the last 168 hours. BNP (last 3 results) No results for input(s): PROBNP in the last 8760 hours. HbA1C: No results for input(s): HGBA1C in the last 72 hours. CBG: No results for input(s): GLUCAP in the last 168 hours. Lipid Profile: No results for input(s): CHOL, HDL, LDLCALC, TRIG, CHOLHDL, LDLDIRECT in the last 72 hours. Thyroid  Function Tests: No results for input(s): TSH, T4TOTAL, FREET4, T3FREE, THYROIDAB in the last 72 hours. Anemia Panel: No results for input(s): VITAMINB12, FOLATE, FERRITIN, TIBC, IRON, RETICCTPCT in the last 72 hours. Urine analysis:    Component Value Date/Time   COLORURINE AMBER (A) 12/25/2017 1811   APPEARANCEUR TURBID (A) 12/25/2017 1811   LABSPEC 1.030 12/25/2017 1811   PHURINE 5.0 12/25/2017 1811   GLUCOSEU NEGATIVE 12/25/2017 1811   HGBUR MODERATE (A) 12/25/2017 1811   BILIRUBINUR NEGATIVE  12/25/2017 1811   KETONESUR 20 (A) 12/25/2017 1811   PROTEINUR 30 (A) 12/25/2017 1811   NITRITE NEGATIVE 12/25/2017 1811   LEUKOCYTESUR NEGATIVE 12/25/2017 1811    Radiological Exams on Admission: DG Knee Complete 4 Views Left Result Date: 05/12/2024 CLINICAL DATA:  Fall, knee pain EXAM: LEFT KNEE - COMPLETE 4+ VIEW COMPARISON:  None Available. FINDINGS: Comminuted fracture noted through the mid pole of the left patella. 1.7 cm of distraction of fracture fragments. Overlying soft tissue swelling. Small joint effusion. No subluxation or dislocation. IMPRESSION: Displaced comminuted left patellar midpole fracture. Electronically Signed   By: Franky Crease M.D.   On: 05/12/2024 20:23   Data Reviewed for HPI: Relevant notes from primary care and specialist visits, past discharge summaries as available in EHR, including Care Everywhere. Prior diagnostic testing as pertinent to current admission diagnoses Updated medications and problem lists for reconciliation ED course, including vitals, labs, imaging, treatment and response to treatment Triage notes, nursing and pharmacy notes and ED provider's notes Notable results as noted above in HPI      Assessment and Plan: * Closed comminuted patellar fracture, left, initial encounter Pain control N.p.o. from midnight and SCD for DVT prophylaxis in preparation for procedure on  05/13/2024 Further orders per orthopedics Formal orthopedic consult placed  Type 2 diabetes mellitus without complication, without long-term current use of insulin  (HCC) Sliding scale insulin  coverage for now  Liver cirrhosis secondary to NASH (HCC) Followed at [facility] liver clinic No acute issues suspected LFTs were normal Continue propranolol  and spironolactone  and furosemide   OSA (obstructive sleep apnea) CPAP nightly if desired  Spinal stenosis of lumbar region with radiculopathy Gabapentin  and methocarbamol  pending med rec  Obesity, Class III, BMI 40-49.9 (morbid  obesity) Complicating factor to overall prognosis and care        DVT prophylaxis: SCD  Consults: Orthopedics, Dr. Edie  Advance Care Planning: full code  Family Communication: none  Disposition Plan: Back to previous home environment  Severity of Illness: The appropriate patient status for this patient is OBSERVATION. Observation status is judged to be reasonable and necessary in order to provide the required intensity of service to ensure the patient's safety. The patient's presenting symptoms, physical exam findings, and initial radiographic and laboratory data in the context of their medical condition is felt to place them at decreased risk for further clinical deterioration. Furthermore, it is anticipated that the patient will be medically stable for discharge from the hospital within 2 midnights of admission.   Author: Delayne LULLA Solian, MD 05/12/2024 9:50 PM  For on call review [email or web address].

## 2024-05-12 NOTE — ED Provider Notes (Signed)
 [facility] Emergency Department Provider Note     Event Date/Time   First MD Initiated Contact with Patient 05/12/24 1946     (approximate)   History   Fall   HPI  Patricia [name] is a 68 y.o. female with a past medical history of HTN, HLD, GERD, hepatic disease, diabetes presents to the ED for evaluation of left knee injury.  Patient reports she was slipping on water at home and landing directly onto her left knee.  Immediate pain and swelling noted to the knee with deformity.  Patient is nonambulatory.  Denies head injury or LOC.  Sensation intact.     Physical Exam   Triage Vital Signs: ED Triage Vitals  Encounter Vitals Group     BP 05/12/24 1940 130/65     Girls Systolic BP Percentile --      Girls Diastolic BP Percentile --      Boys Systolic BP Percentile --      Boys Diastolic BP Percentile --      Pulse Rate 05/12/24 1940 71     Resp 05/12/24 1940 16     Temp 05/12/24 1941 98.3 F (36.8 C)     Temp Source 05/12/24 1941 Oral     SpO2 05/12/24 1937 97 %     Weight 05/12/24 1941 201 lb (91.2 kg)     Height 05/12/24 1941 5' 2 (1.575 m)     Head Circumference --      Peak Flow --      Pain Score 05/12/24 1941 3     Pain Loc --      Pain Education --      Exclude from Growth Chart --     Most recent vital signs: Vitals:   05/12/24 1940 05/12/24 1941  BP: 130/65   Pulse: 71   Resp: 16   Temp:  98.3 F (36.8 C)  SpO2: 98%     General Awake, no distress.  HEENT NCAT.  CV:  Good peripheral perfusion.  RESP:  Normal effort.  ABD:  No distention.  Other:  Left knee reveals moderate edema.  Severe tenderness to palpation.  Limited range of motion with extension or flexion secondary to pain.  Pedal pulses palpated and heard with Doppler.  Good capillary refill.  Full range of motion of digits.  Sensation intact.   ED Results / Procedures / Treatments   Labs (all labs ordered are listed, but only abnormal results are  displayed) Labs Reviewed  CBC - Abnormal; Notable for the following components:      Result Value   WBC 10.8 (*)    Platelets 147 (*)    All other components within normal limits  COMPREHENSIVE METABOLIC PANEL WITH GFR    RADIOLOGY  I personally viewed and evaluated these images as part of my medical decision making, as well as reviewing the written report by the radiologist.  ED Provider Interpretation: Left knee x-ray reveals displaced comminuted left patellar mid pole fracture.  DG Knee Complete 4 Views Left Result Date: 05/12/2024 CLINICAL DATA:  Fall, knee pain EXAM: LEFT KNEE - COMPLETE 4+ VIEW COMPARISON:  None Available. FINDINGS: Comminuted fracture noted through the mid pole of the left patella. 1.7 cm of distraction of fracture fragments. Overlying soft tissue swelling. Small joint effusion. No subluxation or dislocation. IMPRESSION: Displaced comminuted left patellar midpole fracture. Electronically Signed   By: Franky Crease M.D.   On: 05/12/2024 20:23    PROCEDURES:  Critical Care performed: No  Procedures   MEDICATIONS ORDERED IN ED: Medications  ceFAZolin  (ANCEF ) IVPB 2g/100 mL premix (has no administration in time range)  HYDROmorphone  (DILAUDID ) injection 1 mg (1 mg Intravenous Given 05/12/24 2032)     IMPRESSION / MDM / ASSESSMENT AND PLAN / ED COURSE  I reviewed the triage vital signs and the nursing notes.                              Clinical Course as of 05/12/24 2139  Thu May 12, 2024  2031 DG Knee Complete 4 Views Left IMPRESSION: Displaced comminuted left patellar midpole fracture.   [MH]  2032 D/w Dr. Edie who viewed imaging  and agrees with admission. [MH]    Clinical Course User Index [MH] Margrette Monte A, PA-C    68 y.o. female presents to the emergency department for evaluation and treatment of knee injury post fall. See HPI for further details.   Differential diagnosis includes, but is not limited to fracture, dislocation, strain,  sprain  Patient's presentation is most consistent with acute complicated illness / injury requiring diagnostic workup.  Patient is alert and oriented.  She is hemodynamically stable.  Physical exam findings are stated above.  X-ray reveals displaced comminuted left patellar mid pole fracture.  Given patient's inability to ambulate will discuss with orthopedics and try to admit.  Consulted with orthopedic and discussed plan with Dr. Edie who is in agreement with admission.  Recommended knee immobilizer for comfort.  Will reach out to hospitalist team for admission.  Patient is in stable condition at this time.  FINAL CLINICAL IMPRESSION(S) / ED DIAGNOSES   Final diagnoses:  Fall, initial encounter  Closed displaced comminuted fracture of left patella, initial encounter   Rx / DC Orders   ED Discharge Orders     None      Note:  This document was prepared using Dragon voice recognition software and may include unintentional dictation errors.    Margrette, Conor Filsaime A, PA-C 05/12/24 2139    Arlander Charleston, MD 05/16/24 1520

## 2024-05-12 NOTE — Assessment & Plan Note (Addendum)
 Followed at [facility] liver clinic No acute issues suspected LFTs were normal

## 2024-05-12 NOTE — Assessment & Plan Note (Signed)
 Complicating factor to overall prognosis and care

## 2024-05-12 NOTE — ED Notes (Signed)
 Attempted to apply knee immobilizer and patient was unable to tolerate straightening of her leg. She is most comfortable with knee in slight flexion. Provider notified.

## 2024-05-12 NOTE — Assessment & Plan Note (Deleted)
 Pain control N.p.o. from midnight and SCD for DVT prophylaxis in preparation for procedure on 05/13/2024 Further orders per orthopedics Formal orthopedic consult placed

## 2024-05-12 NOTE — Progress Notes (Incomplete)
 Pt arrived

## 2024-05-12 NOTE — Assessment & Plan Note (Signed)
 Gabapentin  and methocarbamol 

## 2024-05-12 NOTE — ED Triage Notes (Signed)
 PT BIB [facility] from home after slipping on water and falling in the kitchen. Pt C/o left knee pain deformity, bruising, and swelling noted to left knee. Pt denies LOC or hitting head. PT Aox4. Pt received 100mcg fentanyl  by EMS

## 2024-05-13 ENCOUNTER — Observation Stay

## 2024-05-13 ENCOUNTER — Observation Stay: Admitting: Certified Registered"

## 2024-05-13 ENCOUNTER — Encounter
Admission: EM | Disposition: A | Discharge status: Home Health | Payer: Self-pay | Source: Home / Self Care | Attending: Emergency Medicine

## 2024-05-13 ENCOUNTER — Other Ambulatory Visit: Payer: Self-pay

## 2024-05-13 DIAGNOSIS — M48061 Spinal stenosis, lumbar region without neurogenic claudication: Secondary | ICD-10-CM

## 2024-05-13 DIAGNOSIS — S82002D Unspecified fracture of left patella, subsequent encounter for closed fracture with routine healing: Secondary | ICD-10-CM | POA: Diagnosis not present

## 2024-05-13 DIAGNOSIS — K7581 Nonalcoholic steatohepatitis (NASH): Secondary | ICD-10-CM

## 2024-05-13 DIAGNOSIS — S82002S Unspecified fracture of left patella, sequela: Secondary | ICD-10-CM | POA: Diagnosis not present

## 2024-05-13 DIAGNOSIS — I959 Hypotension, unspecified: Secondary | ICD-10-CM

## 2024-05-13 DIAGNOSIS — M5416 Radiculopathy, lumbar region: Secondary | ICD-10-CM

## 2024-05-13 DIAGNOSIS — K7460 Unspecified cirrhosis of liver: Secondary | ICD-10-CM

## 2024-05-13 DIAGNOSIS — Z01818 Encounter for other preprocedural examination: Secondary | ICD-10-CM | POA: Diagnosis not present

## 2024-05-13 DIAGNOSIS — I9589 Other hypotension: Secondary | ICD-10-CM | POA: Diagnosis not present

## 2024-05-13 DIAGNOSIS — G4733 Obstructive sleep apnea (adult) (pediatric): Secondary | ICD-10-CM

## 2024-05-13 DIAGNOSIS — S82002A Unspecified fracture of left patella, initial encounter for closed fracture: Secondary | ICD-10-CM

## 2024-05-13 DIAGNOSIS — E119 Type 2 diabetes mellitus without complications: Secondary | ICD-10-CM | POA: Diagnosis not present

## 2024-05-13 DIAGNOSIS — E669 Obesity, unspecified: Secondary | ICD-10-CM

## 2024-05-13 HISTORY — PX: ORIF PATELLA: SHX5033

## 2024-05-13 LAB — GLUCOSE, CAPILLARY
Glucose-Capillary: 81 mg/dL (ref 70–99)
Glucose-Capillary: 100 mg/dL — ABNORMAL HIGH (ref 70–99)
Glucose-Capillary: 80 mg/dL (ref 70–99)
Glucose-Capillary: 62 mg/dL — ABNORMAL LOW (ref 70–99)
Glucose-Capillary: 62 mg/dL — ABNORMAL LOW (ref 70–99)
Glucose-Capillary: 149 mg/dL — ABNORMAL HIGH (ref 70–99)
Glucose-Capillary: 74 mg/dL (ref 70–99)

## 2024-05-13 LAB — SURGICAL PCR SCREEN
MRSA, PCR: NEGATIVE
Staphylococcus aureus: NEGATIVE

## 2024-05-13 LAB — HIV ANTIBODY (ROUTINE TESTING W REFLEX): HIV Screen 4th Generation wRfx: NONREACTIVE

## 2024-05-13 LAB — HEMOGLOBIN A1C
Hgb A1c MFr Bld: 4.9 % (ref 4.8–5.6)
Mean Plasma Glucose: 93.93 mg/dL

## 2024-05-13 SURGERY — OPEN REDUCTION INTERNAL FIXATION (ORIF) PATELLA
Anesthesia: General | Site: Knee | Laterality: Left

## 2024-05-13 MED ORDER — DEXTROSE 50 % IV SOLN
50.0000 mL | Freq: Once | INTRAVENOUS | Status: AC
  Administered 2024-05-13: 50 mL via INTRAVENOUS

## 2024-05-13 MED ORDER — ACETAMINOPHEN 500 MG PO TABS
500.0000 mg | ORAL_TABLET | Freq: Once | ORAL | Status: DC
Start: 2024-05-13 — End: 2024-05-13

## 2024-05-13 MED ORDER — PROPRANOLOL HCL ER 60 MG PO CP24
60.0000 mg | ORAL_CAPSULE | Freq: Every day | ORAL | Status: DC
  Filled 2024-05-13: qty 1

## 2024-05-13 MED ORDER — GABAPENTIN 400 MG PO CAPS
400.0000 mg | ORAL_CAPSULE | Freq: Every day | ORAL | Status: DC
  Administered 2024-05-13 (×2): 400 mg via ORAL
  Filled 2024-05-13 (×2): qty 1

## 2024-05-13 MED ORDER — PROPOFOL 10 MG/ML IV BOLUS
INTRAVENOUS | Status: DC | PRN
Start: 2024-05-13 — End: 2024-05-13
  Administered 2024-05-13: 100 mg via INTRAVENOUS

## 2024-05-13 MED ORDER — ROCURONIUM BROMIDE 100 MG/10ML IV SOLN
INTRAVENOUS | Status: DC | PRN
  Administered 2024-05-13: 50 mg via INTRAVENOUS
  Administered 2024-05-13: 10 mg via INTRAVENOUS

## 2024-05-13 MED ORDER — ACETAMINOPHEN 325 MG PO TABS: 325.0000 mg | ORAL_TABLET | Freq: Four times a day (QID) | ORAL | Status: DC | PRN

## 2024-05-13 MED ORDER — DEXMEDETOMIDINE HCL IN NACL 200 MCG/50ML IV SOLN
INTRAVENOUS | Status: DC | PRN
Start: 2024-05-13 — End: 2024-05-13
  Administered 2024-05-13: 4 ug via INTRAVENOUS

## 2024-05-13 MED ORDER — METOCLOPRAMIDE HCL 5 MG/ML IJ SOLN: 5.0000 mg | Freq: Three times a day (TID) | INTRAMUSCULAR | Status: DC | PRN

## 2024-05-13 MED ORDER — PHENYLEPHRINE HCL-NACL 20-0.9 MG/250ML-% IV SOLN
INTRAVENOUS | Status: DC | PRN
  Administered 2024-05-13: 25 ug/min via INTRAVENOUS

## 2024-05-13 MED ORDER — BUPIVACAINE-EPINEPHRINE 0.5% -1:200000 IJ SOLN
INTRAMUSCULAR | Status: DC | PRN
  Administered 2024-05-13 (×2): 50 mL via INTRAMUSCULAR

## 2024-05-13 MED ORDER — CEFAZOLIN SODIUM-DEXTROSE 2-4 GM/100ML-% IV SOLN
2.0000 g | Freq: Four times a day (QID) | INTRAVENOUS | Status: AC
  Administered 2024-05-13 – 2024-05-14 (×2): 2 g via INTRAVENOUS
  Filled 2024-05-13 (×2): qty 100

## 2024-05-13 MED ORDER — HALOPERIDOL LACTATE 5 MG/ML IJ SOLN: 0.5000 mg | Freq: Once | INTRAMUSCULAR | Status: DC | PRN

## 2024-05-13 MED ORDER — MAGNESIUM HYDROXIDE 400 MG/5ML PO SUSP: 30.0000 mL | Freq: Every day | ORAL | Status: DC | PRN

## 2024-05-13 MED ORDER — LIDOCAINE HCL (CARDIAC) PF 100 MG/5ML IV SOSY
PREFILLED_SYRINGE | INTRAVENOUS | Status: DC | PRN
  Administered 2024-05-13: 100 mg via INTRAVENOUS

## 2024-05-13 MED ORDER — ACETAMINOPHEN 500 MG PO TABS
1000.0000 mg | ORAL_TABLET | Freq: Four times a day (QID) | ORAL | Status: DC
  Administered 2024-05-14 (×2): 1000 mg via ORAL
  Filled 2024-05-13 (×2): qty 2

## 2024-05-13 MED ORDER — BISACODYL 10 MG RE SUPP: 10.0000 mg | Freq: Every day | RECTAL | Status: DC | PRN

## 2024-05-13 MED ORDER — HALOPERIDOL LACTATE 5 MG/ML IJ SOLN
INTRAMUSCULAR | Status: AC
  Filled 2024-05-13: qty 1

## 2024-05-13 MED ORDER — SUCCINYLCHOLINE CHLORIDE 200 MG/10ML IV SOSY
PREFILLED_SYRINGE | INTRAVENOUS | Status: DC | PRN
  Administered 2024-05-13: 100 mg via INTRAVENOUS

## 2024-05-13 MED ORDER — FENTANYL CITRATE (PF) 100 MCG/2ML IJ SOLN
INTRAMUSCULAR | Status: AC
  Filled 2024-05-13: qty 2

## 2024-05-13 MED ORDER — SPIRONOLACTONE 25 MG PO TABS: 50.0000 mg | ORAL_TABLET | Freq: Every day | ORAL | Status: DC

## 2024-05-13 MED ORDER — FENTANYL CITRATE (PF) 100 MCG/2ML IJ SOLN: 25.0000 ug | INTRAMUSCULAR | Status: DC | PRN

## 2024-05-13 MED ORDER — FENTANYL CITRATE (PF) 100 MCG/2ML IJ SOLN
INTRAMUSCULAR | Status: DC | PRN
  Administered 2024-05-13 (×2): 50 ug via INTRAVENOUS

## 2024-05-13 MED ORDER — BUPIVACAINE LIPOSOME 1.3 % IJ SUSP
INTRAMUSCULAR | Status: AC
  Filled 2024-05-13: qty 20

## 2024-05-13 MED ORDER — MIDAZOLAM HCL 2 MG/2ML IJ SOLN
INTRAMUSCULAR | Status: DC | PRN
  Administered 2024-05-13: 2 mg via INTRAVENOUS

## 2024-05-13 MED ORDER — BUPIVACAINE-EPINEPHRINE (PF) 0.5% -1:200000 IJ SOLN
INTRAMUSCULAR | Status: AC
  Filled 2024-05-13: qty 30

## 2024-05-13 MED ORDER — OXYCODONE HCL 5 MG PO TABS: 5.0000 mg | ORAL_TABLET | ORAL | Status: DC | PRN

## 2024-05-13 MED ORDER — FLEET ENEMA RE ENEM: 1.0000 | ENEMA | Freq: Once | RECTAL | Status: DC | PRN

## 2024-05-13 MED ORDER — ONDANSETRON HCL 4 MG/2ML IJ SOLN
INTRAMUSCULAR | Status: DC | PRN
  Administered 2024-05-13 (×2): 4 mg via INTRAVENOUS

## 2024-05-13 MED ORDER — OXYCODONE HCL 5 MG/5ML PO SOLN: 5.0000 mg | Freq: Once | ORAL | Status: DC | PRN

## 2024-05-13 MED ORDER — ONDANSETRON HCL 4 MG PO TABS: 4.0000 mg | ORAL_TABLET | Freq: Four times a day (QID) | ORAL | Status: DC | PRN

## 2024-05-13 MED ORDER — SODIUM CHLORIDE 0.9 % IV BOLUS
500.0000 mL | Freq: Once | INTRAVENOUS | Status: AC
  Administered 2024-05-13: 500 mL via INTRAVENOUS

## 2024-05-13 MED ORDER — MIDAZOLAM HCL 2 MG/2ML IJ SOLN
INTRAMUSCULAR | Status: AC
  Filled 2024-05-13: qty 2

## 2024-05-13 MED ORDER — KETOROLAC TROMETHAMINE 15 MG/ML IJ SOLN
7.5000 mg | Freq: Four times a day (QID) | INTRAMUSCULAR | Status: DC
  Administered 2024-05-14 (×2): 7.5 mg via INTRAVENOUS
  Filled 2024-05-13 (×2): qty 1

## 2024-05-13 MED ORDER — DIPHENHYDRAMINE HCL 12.5 MG/5ML PO ELIX: 12.5000 mg | ORAL_SOLUTION | ORAL | Status: DC | PRN

## 2024-05-13 MED ORDER — DEXTROSE 50 % IV SOLN
INTRAVENOUS | Status: AC
  Filled 2024-05-13: qty 50

## 2024-05-13 MED ORDER — PHENYLEPHRINE 80 MCG/ML (10ML) SYRINGE FOR IV PUSH (FOR BLOOD PRESSURE SUPPORT)
PREFILLED_SYRINGE | INTRAVENOUS | Status: DC | PRN
Start: 2024-05-13 — End: 2024-05-13
  Administered 2024-05-13: 160 ug via INTRAVENOUS
  Administered 2024-05-13: 80 ug via INTRAVENOUS
  Administered 2024-05-13: 160 ug via INTRAVENOUS

## 2024-05-13 MED ORDER — GLYCOPYRROLATE 0.2 MG/ML IJ SOLN
INTRAMUSCULAR | Status: DC | PRN
  Administered 2024-05-13: .2 mg via INTRAVENOUS

## 2024-05-13 MED ORDER — MUPIROCIN 2 % EX OINT
1.0000 | TOPICAL_OINTMENT | Freq: Two times a day (BID) | CUTANEOUS | Status: DC
  Filled 2024-05-13: qty 22

## 2024-05-13 MED ORDER — SUGAMMADEX SODIUM 200 MG/2ML IV SOLN
INTRAVENOUS | Status: DC | PRN
  Administered 2024-05-13: 200 mg via INTRAVENOUS

## 2024-05-13 MED ORDER — ORAL CARE MOUTH RINSE: 15.0000 mL | OROMUCOSAL | Status: DC | PRN

## 2024-05-13 MED ORDER — OXYCODONE HCL 5 MG PO TABS: 5.0000 mg | ORAL_TABLET | Freq: Once | ORAL | Status: DC | PRN

## 2024-05-13 MED ORDER — SODIUM CHLORIDE 0.9 % IV SOLN: INTRAVENOUS | Status: DC

## 2024-05-13 MED ORDER — DOCUSATE SODIUM 100 MG PO CAPS
100.0000 mg | ORAL_CAPSULE | Freq: Two times a day (BID) | ORAL | Status: DC
Start: 2024-05-13 — End: 2024-05-14
  Administered 2024-05-13 – 2024-05-14 (×2): 100 mg via ORAL
  Filled 2024-05-13 (×2): qty 1

## 2024-05-13 MED ORDER — EPHEDRINE SULFATE-NACL 50-0.9 MG/10ML-% IV SOSY
PREFILLED_SYRINGE | INTRAVENOUS | Status: DC | PRN
Start: 2024-05-13 — End: 2024-05-13
  Administered 2024-05-13: 5 mg via INTRAVENOUS

## 2024-05-13 MED ORDER — METOCLOPRAMIDE HCL 5 MG PO TABS: 5.0000 mg | ORAL_TABLET | Freq: Three times a day (TID) | ORAL | Status: DC | PRN

## 2024-05-13 MED ORDER — ATORVASTATIN CALCIUM 20 MG PO TABS
20.0000 mg | ORAL_TABLET | Freq: Every day | ORAL | Status: DC
  Administered 2024-05-13 (×2): 20 mg via ORAL
  Filled 2024-05-13: qty 2
  Filled 2024-05-13: qty 1

## 2024-05-13 MED ORDER — DROPERIDOL 2.5 MG/ML IJ SOLN: 0.6250 mg | Freq: Once | INTRAMUSCULAR | Status: DC | PRN

## 2024-05-13 MED ORDER — FUROSEMIDE 20 MG PO TABS: 20.0000 mg | ORAL_TABLET | Freq: Every day | ORAL | Status: DC | PRN

## 2024-05-13 MED ORDER — ONDANSETRON HCL 4 MG/2ML IJ SOLN: 4.0000 mg | Freq: Four times a day (QID) | INTRAMUSCULAR | Status: DC | PRN

## 2024-05-13 SURGICAL SUPPLY — 48 items
BIT DRILL 2.6 CANN (BIT) IMPLANT
BLADE SURG SZ10 CARB STEEL (BLADE) ×2 IMPLANT
BNDG COHESIVE 4X5 TAN STRL LF (GAUZE/BANDAGES/DRESSINGS) ×1 IMPLANT
BNDG COMPR 6X5.8 VLCR NS LF (GAUZE/BANDAGES/DRESSINGS) ×1 IMPLANT
BNDG ESMARCH 6X12 STRL LF (GAUZE/BANDAGES/DRESSINGS) ×1 IMPLANT
BRACE KNEE POST OP SHORT (BRACE) ×1 IMPLANT
CHLORAPREP W/TINT 26 (MISCELLANEOUS) ×3 IMPLANT
CUFF TRNQT CYL 24X4X16.5-23 (TOURNIQUET CUFF) IMPLANT
CUFF TRNQT CYL 34X4.125X (TOURNIQUET CUFF) IMPLANT
DRAPE C-ARM XRAY 36X54 (DRAPES) ×1 IMPLANT
DRAPE C-ARMOR (DRAPES) ×1 IMPLANT
DRAPE INCISE IOBAN 66X45 STRL (DRAPES) ×1 IMPLANT
DRAPE U-SHAPE 47X51 STRL (DRAPES) ×1 IMPLANT
DRSG OPSITE POSTOP 4X8 (GAUZE/BANDAGES/DRESSINGS) ×1 IMPLANT
ELECT CAUTERY BLADE 6.4 (BLADE) ×1 IMPLANT
ELECTRODE REM PT RTRN 9FT ADLT (ELECTROSURGICAL) ×1 IMPLANT
FIBERTAPE 2 W/STRL NDL 17 (SUTURE) IMPLANT
GAUZE SPONGE 4X4 12PLY STRL (GAUZE/BANDAGES/DRESSINGS) ×1 IMPLANT
GAUZE XEROFORM 1X8 LF (GAUZE/BANDAGES/DRESSINGS) ×1 IMPLANT
GLOVE BIO SURGEON STRL SZ8 (GLOVE) ×1 IMPLANT
GLOVE INDICATOR 8.0 STRL GRN (GLOVE) ×1 IMPLANT
GOWN STRL REUS W/ TWL LRG LVL3 (GOWN DISPOSABLE) ×1 IMPLANT
GUIDEWIRE 1.35MM DUAL TROCAR (WIRE) IMPLANT
HANDLE YANKAUER SUCT BULB TIP (MISCELLANEOUS) ×1 IMPLANT
IMMOB KNEE 24 THIGH 24 443303 (SOFTGOODS) ×1 IMPLANT
KIT TURNOVER KIT A (KITS) ×1 IMPLANT
MANIFOLD NEPTUNE II (INSTRUMENTS) ×1 IMPLANT
NDL FILTER BLUNT 18X1 1/2 (NEEDLE) ×1 IMPLANT
NEEDLE FILTER BLUNT 18X1 1/2 (NEEDLE) ×1 IMPLANT
NS IRRIG 500ML POUR BTL (IV SOLUTION) ×1 IMPLANT
PACK EXTREMITY ARMC (MISCELLANEOUS) ×1 IMPLANT
PAD COLD UNI WRAP-ON (PAD) IMPLANT
PENCIL SMOKE EVACUATOR (MISCELLANEOUS) ×1 IMPLANT
SCREW BN 30X4XCANN BLNT TIP (Screw) IMPLANT
SCREW CANN 4X34 LO PRO (Screw) IMPLANT
SPONGE T-LAP 18X18 ~~LOC~~+RFID (SPONGE) ×1 IMPLANT
STAPLER SKIN PROX 35W (STAPLE) ×1 IMPLANT
STOCKINETTE M/LG 89821 (MISCELLANEOUS) ×1 IMPLANT
SUT ORTHOCORD W/MULTIPK NDL (SUTURE) ×1 IMPLANT
SUT STEEL 7 (SUTURE) ×1 IMPLANT
SUT VIC AB 0 CT1 27XCR 8 STRN (SUTURE) ×1 IMPLANT
SUT VIC AB 0 CT1 36 (SUTURE) ×1 IMPLANT
SUT VIC AB 2-0 CT1 TAPERPNT 27 (SUTURE) ×1 IMPLANT
SUTURE FIBERWR #5 38 CONV BLUE (SUTURE) ×1 IMPLANT
SYR 10ML LL (SYRINGE) ×1 IMPLANT
SYR 30ML LL (SYRINGE) IMPLANT
TRAP FLUID SMOKE EVACUATOR (MISCELLANEOUS) ×1 IMPLANT
WATER STERILE IRR 500ML POUR (IV SOLUTION) ×1 IMPLANT

## 2024-05-13 NOTE — Assessment & Plan Note (Addendum)
 Open reduction internal fixation of displaced left patella fracture by Dr. Edie on 9/26.  Did well with physical therapy recommended home health with rolling walker.  DVT prophylaxis with Lovenox injections for 14 days.  Pain control with tramadol only as needed.

## 2024-05-13 NOTE — Assessment & Plan Note (Addendum)
 BMI 36.77

## 2024-05-13 NOTE — Assessment & Plan Note (Addendum)
 Patient received a fluid bolus yesterday.  Patient has a blood pressure cuff at home and will take her blood pressure.  If blood pressure starts trending up above 140/90 can restart spironolactone .  If still elevated then can restart propranolol .

## 2024-05-13 NOTE — Progress Notes (Signed)
 Progress Note   Patient: Patricia [name] FMW:[id number] DOB: 12/04/1955 DOA: 05/12/2024     0 DOS: the patient was seen and examined on 05/13/2024   Brief hospital course:  68 y.o. female with medical history significant for HTN,, DM, sleep apnea, morbid obesity,  NASH cirrhosis followed at [facility] liver clinic, osteoarthritis, spinal stenosis, being admitted with a displaced comminuted fracture of the left patella, going to the OR with orthopedics, Dr. Edie in the AM.  The fall was accidental ,when she slipped on water in her kitchen, falling onto her left knee.  She was previously in her usual state of health and denied preceding lightheadedness, visual disturbance or headache, palpitations, chest pain or shortness of breath.  Does mention that she had a fall a few months ago on the stairs. In the ED vitals within normal limits.  Labs which included CBC and CMP were normal except for slightly elevated WBC of 10.8. Admission requested   9/26.  Called to see patient secondary to low blood pressure.  Fluid bolus given.  Holding propranolol  and spironolactone .  No symptoms.  EKG reviewed normal sinus rhythm 66 bpm low voltage.   Assessment and Plan: * Preoperative evaluation to rule out surgical contraindication No contraindications to surgery.  Hypotension Fluid bolus.  IV fluid hydration.  Hold propranolol  and spironolactone .  Patient asymptomatic.  Displaced fracture of left patella Seen by orthopedic and patient decided on surgical  repair.  Type 2 diabetes mellitus without complication, without long-term current use of insulin  (HCC) Patient's hemoglobin A1c is low at 4.9.  Discontinue sliding scale.  Liver cirrhosis secondary to NASH (HCC) Followed at [facility] liver clinic No acute issues suspected LFTs normal   OSA (obstructive sleep apnea) CPAP nightly if desired  Obesity (BMI 30-39.9) BMI 36.76  Spinal stenosis of lumbar region with radiculopathy Gabapentin  and  methocarbamol         Subjective: Patient slipped on water in her kitchen falling on her left knee found to have a fracture.  Patient to go to the operating room today.  This morning blood pressure on the lower side and was given IV fluid bolus and holding propranolol  and spironolactone .  Physical Exam: Vitals:   05/13/24 0626 05/13/24 0748 05/13/24 1010 05/13/24 1321  BP: 111/69 (!) 87/56 (!) 98/57 (!) 91/54  Pulse: 69 64 63   Resp:  18 17   Temp:  97.7 F (36.5 C)    TempSrc:  Oral    SpO2:  98% 98%   Weight:      Height:       Physical Exam HENT:     Head: Normocephalic.     Mouth/Throat:     Pharynx: No oropharyngeal exudate.  Eyes:     General: Lids are normal.  Cardiovascular:     Rate and Rhythm: Normal rate and regular rhythm.     Heart sounds: Normal heart sounds, S1 normal and S2 normal.  Pulmonary:     Breath sounds: No decreased breath sounds, wheezing, rhonchi or rales.  Abdominal:     Palpations: Abdomen is soft.     Tenderness: There is no abdominal tenderness.  Musculoskeletal:     Left knee: Swelling present.  Skin:    General: Skin is warm.     Findings: No rash.  Neurological:     Mental Status: She is alert and oriented to person, place, and time.     Data Reviewed: EKG reviewed by me showing normal sinus rhythm 66 bpm with low  voltage.  No acute ST-T wave changes. CMP normal range, CBC shows a white count of 10.8, platelet count 147, hemoglobin 14  Family Communication: Spoke with daughter at bedside  Disposition: Status is: Observation Patient to go to the operating room today  Planned Discharge Destination: To be determined    Time spent: 28 minutes  Author: Charlie Patterson, MD 05/13/2024 2:32 PM  For on call review [email or web address].

## 2024-05-13 NOTE — Op Note (Signed)
 05/13/2024  6:09 PM  Patient:   Patricia [name]  Pre-Op Diagnosis:   Displaced comminuted transverse left patella fracture.  Post-Op Diagnosis:   Same.  Procedure:   Open reduction and internal fixation of displaced left patella fracture.  Surgeon:   DOROTHA Reyes Maltos, MD  Assistant:   None  Anesthesia:   GET  Findings:   As above.  Complications:   None  EBL:   10 cc  Fluids:   400 cc crystalloid  TT:   69 minutes at 300 mmHg  Drains:   None  Closure:   Staples  Implants:   Arthrex cannulated screw and FiberTape system using two 30 mm cannulated screws.  Brief Clinical Note:   The patient is a 68 year old female who sustained the above-noted injury yesterday afternoon when she slipped and fell in her laundry room and landed on her flexed knee. The patient was brought to the emergency room where x-rays demonstrated the above-noted injury. The patient presents at this time for definitive management of this injury.  Procedure:   The patient was brought into the operating room and lain in the supine position. After adequate general endotracheal intubation and anesthesia was obtained, the patient's left lower extremity was prepped with ChloraPrep solution before being draped sterilely. Preoperative antibiotics were administered. A timeout was performed to verify the appropriate surgical site before the limb was exsanguinated with an Esmarch and the tourniquet inflated to 300 mmHg.   An approximately 4-5 inch incision was made over the anterior aspect of the knee, centered over the patella. The incision was carried down through the subcutaneous tissues to expose the superficial retinaculum. This was split the length of the incision and the medial and lateral flaps elevated sufficiently to expose the patella. The fracture hemarthrosis was removed and the exposed fracture margins lightly debrided with a curette before the fracture was irrigated thoroughly with sterile saline using  bulb irrigation.   The fracture fragments were carefully reduced and stabilized using bone holding forceps. The adequacy of fracture reduction was verified fluoroscopically in AP and lateral projections and found to be excellent. Two gold Arthrex guidewires were placed in parallel from superior to inferior. Once their position was verified fluoroscopically, each guidewire was measured for the appropriate screw length, then advanced slightly before being overdrilled. Each screw was inserted over the guidewire, then advanced and tightened securely.    The adequacy of screw position was verified fluoroscopically in AP and lateral projections and found to be excellent. The 2 mm fiber tape was then passed through the cannulated screws in a figure-of-eight configuration using the swedged-on Dustin needle before being tied securely. Again, the adequacy of fracture reduction and hardware position was verified fluoroscopically in AP and lateral projections and found to be excellent. The construct was stable to gentle knee flexion to 80.  The wound was copiously irrigated with sterile saline solution before the medial and lateral retinacular tears were reapproximated using #0 Vicryl interrupted sutures. The superficial retinacular layer also was reapproximated using #0 Vicryl interrupted sutures. The subcutaneous tissues were closed in two layers using 2-0 Vicryl interrupted sutures before the skin was closed using staples. A solution of 30 cc of 0.5% Sensorcaine  with epinephrine  plus 20 cc of Exparel  was injected in and around the incision site to help with postoperative analgesia before a sterile occlusive dressing was applied to the knee. An Ace wrap was placed around the knee to accommodate a Polar Care pack before the patient was placed into  a hinged knee brace with the hinges set at 0-60, but locked in extension. The patient was then awakened, extubated, and returned to the recovery room in satisfactory condition  after tolerating the procedure well.

## 2024-05-13 NOTE — Transfer of Care (Signed)
 Immediate Anesthesia Transfer of Care Note  Patient: Patricia [name]  Procedure(s) Performed: OPEN REDUCTION INTERNAL FIXATION (ORIF) PATELLA (Left: Knee)  Patient Location: PACU  Anesthesia Type:General  Level of Consciousness: awake, drowsy, and patient cooperative  Airway & Oxygen Therapy: Patient Spontanous Breathing  Post-op Assessment: Report given to RN and Post -op Vital signs reviewed and stable  Post vital signs: Reviewed and stable  Last Vitals:  Vitals Value Taken Time  BP 111/53 05/13/24 18:15  Temp    Pulse 77 05/13/24 18:17  Resp 19 05/13/24 18:17  SpO2 97 % 05/13/24 18:17  Vitals shown include unfiled device data.  Last Pain:  Vitals:   05/13/24 1529  TempSrc: Temporal  PainSc:          Complications: No notable events documented.

## 2024-05-13 NOTE — Anesthesia Preprocedure Evaluation (Addendum)
 Anesthesia Evaluation  Patient identified by MRN, date of birth, ID band Patient awake    Reviewed: Allergy & Precautions, H&P , NPO status , Patient's Chart, lab work & pertinent test results  History of Anesthesia Complications Negative for: history of anesthetic complications  Airway Mallampati: III  TM Distance: >3 FB     Dental  (+) Teeth Intact, Caps, Chipped,    Pulmonary sleep apnea and Continuous Positive Airway Pressure Ventilation , COPD, former smoker   breath sounds clear to auscultation       Cardiovascular hypertension, (-) angina (-) Past MI and (-) Cardiac Stents (-) dysrhythmias  Rhythm:regular Rate:Normal     Neuro/Psych  PSYCHIATRIC DISORDERS  Depression    negative neurological ROS     GI/Hepatic ,GERD  Controlled,,(+) Cirrhosis  (NASH)        Endo/Other  diabetes  Class 3 obesity  Renal/GU negative Renal ROS  negative genitourinary   Musculoskeletal  (+) Arthritis ,    Abdominal  (+) + obese  Peds  Hematology negative hematology ROS (+)   Anesthesia Other Findings Hypotension Fluid bolus.  IV fluid hydration.  Hold propranolol  and spironolactone .  Patient asymptomatic.   Past Medical History: No date: Allergy No date: Cancer [facility])     Comment:  cervical No date: Depression No date: Diabetes mellitus without complication (HCC) 2016: Gastritis No date: GERD (gastroesophageal reflux disease) No date: Hepatic disease No date: Hyperlipidemia No date: Hypertension No date: NASH (nonalcoholic steatohepatitis) No date: NASH (nonalcoholic steatohepatitis) No date: Sleep apnea No date: Spinal stenosis  Past Surgical History: No date: APPENDECTOMY 2006: BREAST BIOPSY; Right     Comment:  bx/clip-neg No date: CARPAL TUNNEL RELEASE; Right No date: CERVICAL DISCECTOMY No date: CHOLECYSTECTOMY 05/07/2018: COLONOSCOPY WITH PROPOFOL ; N/A     Comment:  Procedure: COLONOSCOPY WITH PROPOFOL ;   Surgeon: Viktoria Lamar DASEN, MD;  Location: [facility] ENDOSCOPY;  Service:               Endoscopy;  Laterality: N/A; No date: KNEE ARTHROSCOPY; Bilateral No date: LIVER BIOPSY No date: ROTATOR CUFF REPAIR; Right No date: TONSILLECTOMY No date: uterine ablation  BMI    Body Mass Index: 42.07 kg/m      Reproductive/Obstetrics negative OB ROS                              Anesthesia Physical Anesthesia Plan  ASA: III  Anesthesia Plan: General   Post-op Pain Management: Tylenol  PO (pre-op)* and Toradol  IV (intra-op)*   Induction: Intravenous  PONV Risk Score and Plan: Dexamethasone  and Ondansetron   Airway Management Planned: Oral ETT  Additional Equipment:   Intra-op Plan:   Post-operative Plan: Extubation in OR  Informed Consent: I have reviewed the patients History and Physical, chart, labs and discussed the procedure including the risks, benefits and alternatives for the proposed anesthesia with the patient or authorized representative who has indicated his/her understanding and acceptance.     Dental Advisory Given  Plan Discussed with: Anesthesiologist, CRNA and Surgeon  Anesthesia Plan Comments:          Anesthesia Quick Evaluation

## 2024-05-13 NOTE — Consult Note (Signed)
 ORTHOPAEDIC CONSULTATION  REQUESTING PHYSICIAN: Josette Ade, MD  Chief Complaint:   Left knee pain.  History of Present Illness: Patricia [name] is a 68 y.o. female with a history of diabetes, hypertension, hyperlipidemia, sleep apnea, and depression who normally lives independently and ambulates without any assistive devices.  The patient was in her usual state of health late yesterday afternoon when she apparently slipped on some water in her laundry room and fell onto her flexed left knee.  She was brought to the emergency room where x-rays demonstrated a displaced left patella fracture.  The patient has been admitted for definitive management of this injury.  The patient denies any associated injuries.  She did not strike her head or lose conscious.  The patient also denies any lightheadedness, dizziness, chest pain, shortness of breath, or other symptoms which may have precipitated her fall.  She is status post a prior arthroscopic partial meniscectomy of the left knee by Dr. Kathlynn and has a history of degenerative joint disease of this knee.  Past Medical History:  Diagnosis Date   Allergy    Cancer (HCC)    cervical   Current smoker 08/22/2016   Depression    Diabetes mellitus without complication (HCC)    Gastritis 2016   GERD (gastroesophageal reflux disease)    Hepatic disease    Hyperlipidemia    Hypertension    NASH (nonalcoholic steatohepatitis)    Sleep apnea    Spinal stenosis    Past Surgical History:  Procedure Laterality Date   APPENDECTOMY     BREAST BIOPSY Right 2006   bx/clip-neg   CARPAL TUNNEL RELEASE Right    CERVICAL CONE BIOPSY     CERVICAL DISCECTOMY     CHOLECYSTECTOMY     COLONOSCOPY WITH PROPOFOL  N/A 05/07/2018   Procedure: COLONOSCOPY WITH PROPOFOL ;  Surgeon: Viktoria Lamar DASEN, MD;  Location: [facility] ENDOSCOPY;  Service: Endoscopy;  Laterality: N/A;   COLONOSCOPY WITH PROPOFOL  N/A  06/23/2023   Procedure: COLONOSCOPY WITH PROPOFOL ;  Surgeon: Toledo, Ladell POUR, MD;  Location: [facility] ENDOSCOPY;  Service: Gastroenterology;  Laterality: N/A;   ESOPHAGOGASTRODUODENOSCOPY (EGD) WITH PROPOFOL  N/A 09/10/2020   Procedure: ESOPHAGOGASTRODUODENOSCOPY (EGD) WITH PROPOFOL ;  Surgeon: Unk Corinn Skiff, MD;  Location: [facility] ENDOSCOPY;  Service: Gastroenterology;  Laterality: N/A;   ESOPHAGOGASTRODUODENOSCOPY (EGD) WITH PROPOFOL  N/A 06/23/2023   Procedure: ESOPHAGOGASTRODUODENOSCOPY (EGD) WITH PROPOFOL ;  Surgeon: Toledo, Ladell POUR, MD;  Location: [facility] ENDOSCOPY;  Service: Gastroenterology;  Laterality: N/A;   KNEE ARTHROSCOPY Bilateral    LIVER BIOPSY     MOHS SURGERY     face   ROTATOR CUFF REPAIR Right    TONSILLECTOMY     uterine ablation     Social History   Socioeconomic History   Marital status: Widowed    Spouse name: Not on file   Number of children: Not on file   Years of education: Not on file   Highest education level: Not on file  Occupational History   Not on file  Tobacco Use   Smoking status: Former    Current packs/day: 0.00    Average packs/day: 1 pack/day for 30.0 years (30.0 ttl pk-yrs)    Types: Cigarettes    Start date: 10/25/1990    Quit date: 10/24/2020    Years since quitting: 3.5   Smokeless tobacco: Never   Tobacco comments:    has quit in the past multiple times  Vaping Use   Vaping status: Never Used  Substance and Sexual Activity   Alcohol  use: Not Currently   Drug use: No   Sexual activity: Not on file  Other Topics Concern   Not on file  Social History Narrative   Not on file   Social Drivers of Health   Financial Resource Strain: Low Risk  (02/16/2024)   Received from [facility]   Overall Financial Resource Strain (CARDIA)    Difficulty of Paying Living Expenses: Not hard at all  Food Insecurity: No Food Insecurity (05/13/2024)   Hunger Vital Sign    Worried About Running Out of Food in the Last Year: Never true     Ran Out of Food in the Last Year: Never true  Transportation Needs: No Transportation Needs (05/13/2024)   PRAPARE - Administrator, Civil Service (Medical): No    Lack of Transportation (Non-Medical): No  Physical Activity: Not on file  Stress: Not on file  Social Connections: Not on file   Family History  Problem Relation Age of Onset   Diabetes Father    Diabetes Sister    Diabetes Paternal Grandmother    Heart disease Mother    Hypertension Mother    Hyperlipidemia Mother    Bipolar disorder Daughter    Breast cancer Neg Hx    Allergies  Allergen Reactions   Codeine Hives   Other Rash, Other (See Comments) and Itching    D-Vaseo, Christine Hydrate  Reaction: nausea and vomiting    Pantoprazole Rash and Other (See Comments)   Paroxetine Nausea And Vomiting   Promethazine Other (See Comments)    Reaction: involuntary jerking - fenergan   Amlodipine Other (See Comments)    Reaction: weakness, sleep    Deconsal Ii [Phenylephrine -Guaifenesin]     Other reaction(s): Unknown    Pantoprazole Sodium Nausea And Vomiting   Paxil  [Paroxetine Hcl] Nausea And Vomiting   Pseudoephedrine Other (See Comments)    Reaction: Dysesthesia    Omeprazole Rash   Povidone Iodine Rash   Prior to Admission medications   Medication Sig Start Date End Date Taking? Authorizing Provider  albuterol  (VENTOLIN  HFA) 108 (90 Base) MCG/ACT inhaler Inhale two puffs into the lungs every 4 to 6 hours as needed. 05/05/24  Yes [provider]  MOUNJARO  10 MG/0.5ML Pen Inject 10 mg into the skin once a week. 08/21/23  Yes [provider]  atorvastatin  (LIPITOR) 20 MG tablet Take 1 tablet (20 mg total) by mouth at bedtime 05/20/23     azelastine  (ASTELIN ) 0.1 % nasal spray Place 1 spray into both nostrils 2 (two) times daily. 05/09/24     fluticasone (FLONASE) 50 MCG/ACT nasal spray  08/04/19   [provider]  furosemide  (LASIX ) 20 MG tablet Take 1 tablet (20 mg total) by  mouth daily as needed. 07/30/23     gabapentin  (NEURONTIN ) 400 MG capsule Take 1 capsule (400 mg total) by mouth at bedtime. 07/30/23     Glucosamine-Chondroitin 250-200 MG TABS Take 1 tablet by mouth 2 (two) times daily.     [provider]  glucose blood (FREESTYLE LITE) test strip Use to test blood sugar 3 times a day as needed 05/20/23     ibuprofen  (ADVIL ) 600 MG tablet Take 600 mg by mouth 3 (three) times daily.    [provider]  lidocaine  (LIDODERM ) 5 % Place 1 patch onto the skin daily. Apply patch to the most painful area for up to 12 hours in a 24 hour period. 01/27/23     metFORMIN  (GLUCOPHAGE )  500 MG tablet Take 1 tablet (500 mg total) by mouth daily with breakfast 05/20/23     methocarbamol  (ROBAXIN ) 750 MG tablet Take 1 tablet (750 mg total) by mouth daily as needed. 05/20/23     nystatin  (MYCOSTATIN /NYSTOP ) powder Apply 1 Application topically 3 (three) times daily as needed. 12/24/22     ondansetron  (ZOFRAN ) 4 MG tablet 4 mg 10/02/20   [provider]  propranolol  ER (INDERAL  LA) 60 MG 24 hr capsule Take 1 capsule (60 mg total) by mouth daily. 07/30/23     spironolactone  (ALDACTONE ) 50 MG tablet Take 1 tablet (50 mg total) by mouth once daily 05/20/23     tirzepatide  (MOUNJARO ) 7.5 MG/0.5ML Pen Inject 0.5 mLs (7.5 mg total) subcutaneously once a week 08/21/23     zolpidem  (AMBIEN ) 5 MG tablet Take 1 tablet (5 mg total) by mouth at bedtime as needed. 07/30/23      DG Knee Complete 4 Views Left Result Date: 05/12/2024 CLINICAL DATA:  Fall, knee pain EXAM: LEFT KNEE - COMPLETE 4+ VIEW COMPARISON:  None Available. FINDINGS: Comminuted fracture noted through the mid pole of the left patella. 1.7 cm of distraction of fracture fragments. Overlying soft tissue swelling. Small joint effusion. No subluxation or dislocation. IMPRESSION: Displaced comminuted left patellar midpole fracture. Electronically Signed   By: Franky Crease M.D.   On: 05/12/2024 20:23    Positive ROS:  All other systems have been reviewed and were otherwise negative with the exception of those mentioned in the HPI and as above.  Physical Exam: General:  Alert, no acute distress Psychiatric:  Patient is competent for consent with normal mood and affect   Cardiovascular:  No pedal edema Respiratory:  No wheezing, non-labored breathing GI:  Abdomen is soft and non-tender Skin:  No lesions in the area of chief complaint Neurologic:  Sensation intact distally Lymphatic:  No axillary or cervical lymphadenopathy  Orthopedic Exam:  Orthopedic examination is limited to the left knee and lower extremity.  Skin inspection around the left knee is notable for early ecchymosis and moderate swelling, as well as well-healed arthroscopic portal sites.  She also has a 2+ effusion.  No erythema, abrasions, or other skin abnormalities are identified.  She has moderate tenderness to palpation over the anterior aspect of the knee.  She has significant pain with any attempted active or passive motion of the knee.  She is grossly neurovascularly intact to the left foot and lower leg.  X-rays:  X-rays of the left knee are available for review and have been reviewed by myself.  These films demonstrate a primarily transverse fracture of the patella with some comminution.  Early degenerative changes as well as a moderate effusion also are noted.  No lytic lesions or other acute bony processes are identified.  Assessment: Displaced primarily transverse fracture of left patella.  Plan: The treatment options, including both surgical and nonsurgical choices, have been discussed in detail with the patient and her daughter who is at the bedside.  The patient would like to proceed with surgical intervention to include an open reduction and internal fixation of her left patella fracture.  The risks (including bleeding, infection, nerve and/or blood vessel injury, persistent or recurrent pain, loosening or failure of the  components, leg length inequality, dislocation, need for further surgery, blood clots, strokes, heart attacks or arrhythmias, pneumonia, etc.) and benefits of the surgical procedure were discussed.  The patient states her understanding and agrees to proceed.  A formal written consent will  be obtained by the nursing staff.  Thank you for asking me to participate in the care of this most pleasant unfortunate woman.  I will be happy to follow her with you.   Patricia Reyes Maltos, MD  Beeper #:  [phone]  05/13/2024 8:00 AM

## 2024-05-13 NOTE — Hospital Course (Addendum)
 68 y.o. female with medical history significant for HTN,, DM, sleep apnea, morbid obesity,  NASH cirrhosis followed at [facility] liver clinic, osteoarthritis, spinal stenosis, being admitted with a displaced comminuted fracture of the left patella, going to the OR with orthopedics, Dr. Edie in the AM.  The fall was accidental ,when she slipped on water in her kitchen, falling onto her left knee.  She was previously in her usual state of health and denied preceding lightheadedness, visual disturbance or headache, palpitations, chest pain or shortness of breath.  Does mention that she had a fall a few months ago on the stairs. In the ED vitals within normal limits.  Labs which included CBC and CMP were normal except for slightly elevated WBC of 10.8. Admission requested   9/26.  Called to see patient secondary to low blood pressure.  Fluid bolus given.  Holding propranolol  and spironolactone .  No symptoms.  EKG reviewed normal sinus rhythm 66 bpm low voltage.  Operation room for open reduction internal fixation of displaced left patella fracture by Dr. Edie. 9/27.  Patient was able to walk 150 feet with physical therapist with rolling walker.  Stable to go home.  Lovenox injections for 14 days for DVT prophylaxis.  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

## 2024-05-13 NOTE — Assessment & Plan Note (Deleted)
No contraindications to surgery.

## 2024-05-13 NOTE — Anesthesia Procedure Notes (Signed)
 Procedure Name: Intubation Date/Time: 05/13/2024 4:28 PM  Performed by: Ledora Duncan, CRNAPre-anesthesia Checklist: Patient identified, Emergency Drugs available, Suction available and Patient being monitored Patient Re-evaluated:Patient Re-evaluated prior to induction Oxygen Delivery Method: Circle system utilized Preoxygenation: Pre-oxygenation with 100% oxygen Induction Type: IV induction Ventilation: Mask ventilation without difficulty Laryngoscope Size: McGrath and 3 Grade View: Grade I Tube type: Oral Tube size: 6.5 mm Number of attempts: 1 Airway Equipment and Method: Stylet and Oral airway Placement Confirmation: ETT inserted through vocal cords under direct vision, positive ETCO2 and breath sounds checked- equal and bilateral Secured at: 21 cm Tube secured with: Tape Dental Injury: Teeth and Oropharynx as per pre-operative assessment

## 2024-05-14 ENCOUNTER — Other Ambulatory Visit: Payer: Self-pay

## 2024-05-14 DIAGNOSIS — I9589 Other hypotension: Secondary | ICD-10-CM | POA: Diagnosis not present

## 2024-05-14 DIAGNOSIS — S82002S Unspecified fracture of left patella, sequela: Secondary | ICD-10-CM | POA: Diagnosis not present

## 2024-05-14 DIAGNOSIS — K7581 Nonalcoholic steatohepatitis (NASH): Secondary | ICD-10-CM | POA: Diagnosis not present

## 2024-05-14 DIAGNOSIS — E119 Type 2 diabetes mellitus without complications: Secondary | ICD-10-CM | POA: Diagnosis not present

## 2024-05-14 DIAGNOSIS — S82002D Unspecified fracture of left patella, subsequent encounter for closed fracture with routine healing: Secondary | ICD-10-CM | POA: Diagnosis not present

## 2024-05-14 DIAGNOSIS — Z01818 Encounter for other preprocedural examination: Secondary | ICD-10-CM | POA: Diagnosis not present

## 2024-05-14 LAB — BASIC METABOLIC PANEL WITH GFR
Anion gap: 6 (ref 5–15)
BUN: 18 mg/dL (ref 8–23)
CO2: 27 mmol/L (ref 22–32)
Calcium: 8.2 mg/dL — ABNORMAL LOW (ref 8.9–10.3)
Chloride: 106 mmol/L (ref 98–111)
Creatinine, Ser: 1.08 mg/dL — ABNORMAL HIGH (ref 0.44–1.00)
GFR, Estimated: 57 mL/min — ABNORMAL LOW (ref >60)
Glucose, Bld: 104 mg/dL — ABNORMAL HIGH (ref 70–99)
Potassium: 4.3 mmol/L (ref 3.5–5.1)
Sodium: 139 mmol/L (ref 135–145)

## 2024-05-14 LAB — CBC
HCT: 34 % — ABNORMAL LOW (ref 36.0–46.0)
Hemoglobin: 11.3 g/dL — ABNORMAL LOW (ref 12.0–15.0)
MCH: 31.4 pg (ref 26.0–34.0)
MCHC: 33.2 g/dL (ref 30.0–36.0)
MCV: 94.4 fL (ref 80.0–100.0)
Platelets: 131 K/uL — ABNORMAL LOW (ref 150–400)
RBC: 3.6 MIL/uL — ABNORMAL LOW (ref 3.87–5.11)
RDW: 13.2 % (ref 11.5–15.5)
WBC: 9.3 K/uL (ref 4.0–10.5)
nRBC: 0 % (ref 0.0–0.2)

## 2024-05-14 LAB — GLUCOSE, CAPILLARY: Glucose-Capillary: 93 mg/dL (ref 70–99)

## 2024-05-14 MED ORDER — ENOXAPARIN SODIUM 60 MG/0.6ML IJ SOSY
0.5000 mg/kg | PREFILLED_SYRINGE | INTRAMUSCULAR | Status: DC
  Administered 2024-05-14: 45 mg via SUBCUTANEOUS
  Filled 2024-05-14: qty 0.6

## 2024-05-14 MED ORDER — ENOXAPARIN SODIUM 40 MG/0.4ML IJ SOSY
40.0000 mg | PREFILLED_SYRINGE | INTRAMUSCULAR | 0 refills | Status: AC
  Filled 2024-05-14: qty 5.6, 14d supply, fill #0

## 2024-05-14 MED ORDER — OXYCODONE HCL 5 MG PO TABS: 5.0000 mg | ORAL_TABLET | ORAL | 0 refills | Status: DC | PRN

## 2024-05-14 MED ORDER — ENOXAPARIN SODIUM 60 MG/0.6ML IJ SOSY: 0.5000 mg/kg | PREFILLED_SYRINGE | INTRAMUSCULAR | 0 refills | Status: DC

## 2024-05-14 MED ORDER — TRAMADOL HCL 50 MG PO TABS: 50.0000 mg | ORAL_TABLET | Freq: Four times a day (QID) | ORAL | Status: DC | PRN

## 2024-05-14 MED ORDER — TRAMADOL HCL 50 MG PO TABS
50.0000 mg | ORAL_TABLET | Freq: Four times a day (QID) | ORAL | 0 refills | Status: AC | PRN
  Filled 2024-05-14: qty 20, 5d supply, fill #0

## 2024-05-14 MED ORDER — ACETAMINOPHEN 325 MG PO TABS: 325.0000 mg | ORAL_TABLET | Freq: Four times a day (QID) | ORAL | Status: AC | PRN

## 2024-05-14 NOTE — Discharge Instructions (Addendum)
 Take your blood pressure at home, if starts to go above 140/90 can restart spironolactone  and if still elevated can restart propranolol    INSTRUCTIONS AFTER Surgery   Remove items at home which could result in a fall. This includes throw rugs or furniture in walking pathways ICE to the affected joint every three hours while awake for 30 minutes at a time, for at least the first 3-5 days, and then as needed for pain and swelling.  Continue to use ice for pain and swelling. You may notice swelling that will progress down to the foot and ankle.  This is normal after surgery.  Elevate your leg when you are not up walking on it.   Continue to use the breathing machine you got in the hospital (incentive spirometer) which will help keep your temperature down.  It is common for your temperature to cycle up and down following surgery, especially at night when you are not up moving around and exerting yourself.  The breathing machine keeps your lungs expanded and your temperature down.   DIET:  As you were doing prior to hospitalization, we recommend a well-balanced diet.  DRESSING / WOUND CARE / SHOWERING  Dressing change as needed.  Plan to follow-up with [facility] clinic orthopedics in 2 weeks for complete bandage change and staple removal.  Knee immobilizer will stay on at all times.  No showering.  ACTIVITY  Increase activity slowly as tolerated, but follow the weight bearing instructions below.   No driving for 6 weeks or until further direction given by your physician.  You cannot drive while taking narcotics.  No lifting or carrying greater than 10 lbs. until further directed by your surgeon. Avoid periods of inactivity such as sitting longer than an hour when not asleep. This helps prevent blood clots.  You may return to work once you are authorized by your doctor.     WEIGHT BEARING  Weightbearing as tolerated on the left   EXERCISES Ambulation training but no range of motion  activities with the left knee  CONSTIPATION  Constipation is defined medically as fewer than three stools per week and severe constipation as less than one stool per week.  Even if you have a regular bowel pattern at home, your normal regimen is likely to be disrupted due to multiple reasons following surgery.  Combination of anesthesia, postoperative narcotics, change in appetite and fluid intake all can affect your bowels.   YOU MUST use at least one of the following options; they are listed in order of increasing strength to get the job done.  They are all available over the counter, and you may need to use some, POSSIBLY even all of these options:    Drink plenty of fluids (prune juice may be helpful) and high fiber foods Colace 100 mg by mouth twice a day  Senokot for constipation as directed and as needed Dulcolax (bisacodyl ), take with full glass of water  Miralax (polyethylene glycol) once or twice a day as needed.  If you have tried all these things and are unable to have a bowel movement in the first 3-4 days after surgery call either your surgeon or your primary doctor.    If you experience loose stools or diarrhea, hold the medications until you stool forms back up.  If your symptoms do not get better within 1 week or if they get worse, check with your doctor.  If you experience the worst abdominal pain ever or develop nausea or vomiting, please  contact the office immediately for further recommendations for treatment.   ITCHING:  If you experience itching with your medications, try taking only a single pain pill, or even half a pain pill at a time.  You can also use Benadryl  over the counter for itching or also to help with sleep.   TED HOSE STOCKINGS:  Use stockings on both legs until for at least 2 weeks or as directed by physician office. They may be removed at night for sleeping.  MEDICATIONS:  See your medication summary on the "After Visit Summary" that nursing will review  with you.  You may have some home medications which will be placed on hold until you complete the course of blood thinner medication.  It is important for you to complete the blood thinner medication as prescribed.  PRECAUTIONS:  If you experience chest pain or shortness of breath - call 911 immediately for transfer to the hospital emergency department.   If you develop a fever greater that 101 F, purulent drainage from wound, increased redness or drainage from wound, foul odor from the wound/dressing, or calf pain - CONTACT YOUR SURGEON.                                                   FOLLOW-UP APPOINTMENTS:  If you do not already have a post-op appointment, please call the office for an appointment to be seen by your surgeon.  Guidelines for how soon to be seen are listed in your "After Visit Summary", but are typically between 1-4 weeks after surgery.  OTHER INSTRUCTIONS:     MAKE SURE YOU:  Understand these instructions.  Get help right away if you are not doing well or get worse.    Thank you for letting us  be a part of your medical care team.  It is a privilege we respect greatly.  We hope these instructions will help you stay on track for a fast and full recovery!

## 2024-05-14 NOTE — Progress Notes (Signed)
  Subjective: 1 Day Post-Op Procedure(s) (LRB): OPEN REDUCTION INTERNAL FIXATION (ORIF) PATELLA (Left) Patient reports pain as moderate.   Patient is well, and has had no acute complaints or problems Plan is to go Home after hospital stay. Negative for chest pain and shortness of breath Fever: no Gastrointestinal: Negative for nausea and vomiting  Objective: Vital signs in last 24 hours: Temp:  [96.5 F (35.8 C)-99.1 F (37.3 C)] 97.6 F (36.4 C) (09/27 0450) Pulse Rate:  [62-84] 62 (09/27 0450) Resp:  [11-18] 15 (09/27 0450) BP: (87-121)/(49-79) 115/56 (09/27 0450) SpO2:  [90 %-99 %] 96 % (09/27 0450) Weight:  [91.2 kg] 91.2 kg (09/26 1529)  Intake/Output from previous day:  Intake/Output Summary (Last 24 hours) at 05/14/2024 0728 Last data filed at 05/14/2024 0520 Gross per 24 hour  Intake 1162.5 ml  Output 10 ml  Net 1152.5 ml    Intake/Output this shift: No intake/output data recorded.  Labs: Recent Labs    05/12/24 2037 05/14/24 0420  HGB 14.0 11.3*   Recent Labs    05/12/24 2037 05/14/24 0420  WBC 10.8* 9.3  RBC 4.45 3.60*  HCT 41.8 34.0*  PLT 147* 131*   Recent Labs    05/12/24 2037 05/14/24 0420  NA 141 139  K 4.4 4.3  CL 106 106  CO2 25 27  BUN 16 18  CREATININE 0.96 1.08*  GLUCOSE 96 104*  CALCIUM  9.1 8.2*   No results for input(s): LABPT, INR in the last 72 hours.   EXAM General - Patient is Alert and Oriented Extremity - Neurovascular intact Sensation intact distally Dorsiflexion/Plantar flexion intact Dressing/Incision - clean, dry, with a hinged brace and Polar Care in place Motor Function - intact, moving foot and toes well on exam.   Past Medical History:  Diagnosis Date   Allergy    Cancer (HCC)    cervical   Current smoker 08/22/2016   Depression    Diabetes mellitus without complication (HCC)    Gastritis 2016   GERD (gastroesophageal reflux disease)    Hepatic disease    Hyperlipidemia    Hypertension    NASH  (nonalcoholic steatohepatitis)    Sleep apnea    Spinal stenosis     Assessment/Plan: 1 Day Post-Op Procedure(s) (LRB): OPEN REDUCTION INTERNAL FIXATION (ORIF) PATELLA (Left) Principal Problem:   Preoperative evaluation to rule out surgical contraindication Active Problems:   Liver cirrhosis secondary to NASH [facility])   Spinal stenosis of lumbar region with radiculopathy   Type 2 diabetes mellitus without complication, without long-term current use of insulin  (HCC)   OSA (obstructive sleep apnea)   Hypotension   Displaced fracture of left patella   Obesity (BMI 30-39.9)  Estimated body mass index is 36.77 kg/m as calculated from the following:   Height as of this encounter: 5' 2 (1.575 m).   Weight as of this encounter: 91.2 kg. Advance diet Up with therapy  Discharge planning: Discharge home after physical therapy.  Follow-up with [facility] clinic orthopedics in 2 weeks for staple removal and x-rays of the left knee.  No showering.  DVT Prophylaxis - Lovenox Weight-Bearing as tolerated to left leg  Krystal Doyne, PA-C Orthopaedic Surgery 05/14/2024, 7:28 AM

## 2024-05-14 NOTE — Discharge Summary (Signed)
 Physician Discharge Summary   Patient: Patricia [name] MRN: [id number] DOB: 1955-09-26  Admit date:     05/12/2024  Discharge date: 05/14/24  Discharge Physician: Charlie Patterson   PCP: Marikay Eva POUR, PA   Recommendations at discharge:   Follow-up PCP 5 days Follow-up orthopedic surgery 2 weeks  Discharge Diagnoses: Principal Problem:   Displaced fracture of left patella Active Problems:   Preoperative evaluation to rule out surgical contraindication   Hypotension   Type 2 diabetes mellitus without complication, without long-term current use of insulin  (HCC)   Liver cirrhosis secondary to NASH (HCC)   OSA (obstructive sleep apnea)   Spinal stenosis of lumbar region with radiculopathy   Obesity (BMI 30-39.9)  Resolved Problems:   * No resolved hospital problems. *  Hospital Course:  68 y.o. female with medical history significant for HTN,, DM, sleep apnea, morbid obesity,  NASH cirrhosis followed at [facility] liver clinic, osteoarthritis, spinal stenosis, being admitted with a displaced comminuted fracture of the left patella, going to the OR with orthopedics, Dr. Edie in the AM.  The fall was accidental ,when she slipped on water in her kitchen, falling onto her left knee.  She was previously in her usual state of health and denied preceding lightheadedness, visual disturbance or headache, palpitations, chest pain or shortness of breath.  Does mention that she had a fall a few months ago on the stairs. In the ED vitals within normal limits.  Labs which included CBC and CMP were normal except for slightly elevated WBC of 10.8. Admission requested   9/26.  Called to see patient secondary to low blood pressure.  Fluid bolus given.  Holding propranolol  and spironolactone .  No symptoms.  EKG reviewed normal sinus rhythm 66 bpm low voltage.  Operation room for open reduction internal fixation of displaced left patella fracture by Dr. Edie. 9/27.  Patient was able to walk 150 feet  with physical therapist with rolling walker.  Stable to go home.  Lovenox injections for 14 days for DVT prophylaxis.  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   Assessment and Plan: * Displaced fracture of left patella Open reduction internal fixation of displaced left patella fracture by Dr. Edie on 9/26.  Did well with physical therapy recommended home health with rolling walker.  DVT prophylaxis with Lovenox injections for 14 days.  Pain control with tramadol only as needed.  Hypotension Patient received a fluid bolus yesterday.  Patient has a blood pressure cuff at home and will take her blood pressure.  If blood pressure starts trending up above 140/90 can restart spironolactone .  If still elevated then can restart propranolol .  Type 2 diabetes mellitus without complication, without long-term current use of insulin  (HCC) Patient's hemoglobin A1c is low at 4.9.  Discontinue metformin .  Patient will speak with her medical doctor about Mounjaro .  She states she takes it for diabetes not weight loss.  Episode of hypoglycemia secondary to prolonged not eating yesterday with being n.p.o. and having a late surgery.  Liver cirrhosis secondary to NASH (HCC) Followed at [facility] liver clinic No acute issues suspected LFTs normal   OSA (obstructive sleep apnea) CPAP nightly if desired  Obesity (BMI 30-39.9) BMI 36.77  Spinal stenosis of lumbar region with radiculopathy Gabapentin  and methocarbamol          Consultants: Orthopedic surgery Procedures performed: ORIF left patella Disposition: Home health Diet recommendation:  Cardiac and Carb modified diet DISCHARGE MEDICATION: Allergies as of 05/14/2024  Reactions   Codeine Hives   Other Rash, Other (See Comments), Itching   D-Vaseo, Christine Hydrate Reaction: nausea and vomiting    Pantoprazole Rash, Other (See Comments)   Paroxetine Nausea And Vomiting   Promethazine Other (See Comments)   Reaction: involuntary  jerking - fenergan   Amlodipine Other (See Comments)   Reaction: weakness, sleep    Deconsal Ii [phenylephrine -guaifenesin]    Other reaction(s): Unknown   Pantoprazole Sodium Nausea And Vomiting   Paxil  [paroxetine Hcl] Nausea And Vomiting   Pseudoephedrine Other (See Comments)   Reaction: Dysesthesia    Omeprazole Rash   Povidone Iodine Rash        Medication List     STOP taking these medications    metFORMIN  500 MG tablet Commonly known as: GLUCOPHAGE    propranolol  ER 60 MG 24 hr capsule Commonly known as: INDERAL  LA   spironolactone  50 MG tablet Commonly known as: ALDACTONE        TAKE these medications    acetaminophen  325 MG tablet Commonly known as: TYLENOL  Take 1-2 tablets (325-650 mg total) by mouth every 6 (six) hours as needed for mild pain (pain score 1-3) or fever (or temp > 100.5).   albuterol  108 (90 Base) MCG/ACT inhaler Commonly known as: VENTOLIN  HFA Inhale two puffs into the lungs every 4 to 6 hours as needed.   atorvastatin  20 MG tablet Commonly known as: LIPITOR Take 1 tablet (20 mg total) by mouth at bedtime   Azelastine  HCl 137 MCG/SPRAY Soln Place 1 spray into both nostrils 2 (two) times daily.   enoxaparin 40 MG/0.4ML injection Commonly known as: LOVENOX Inject 0.4 mLs (40 mg total) into the skin daily.   fluticasone 50 MCG/ACT nasal spray Commonly known as: FLONASE   FREESTYLE LITE test strip Generic drug: glucose blood Use to test blood sugar 3 times a day as needed   furosemide  20 MG tablet Commonly known as: LASIX  Take 1 tablet (20 mg total) by mouth daily as needed.   gabapentin  400 MG capsule Commonly known as: NEURONTIN  Take 1 capsule (400 mg total) by mouth at bedtime.   Glucosamine-Chondroitin 250-200 MG Tabs Take 1 tablet by mouth 2 (two) times daily.   ibuprofen  600 MG tablet Commonly known as: ADVIL  Take 600 mg by mouth 3 (three) times daily.   lidocaine  5 % Commonly known as: LIDODERM  Place 1 patch  onto the skin daily. Apply patch to the most painful area for up to 12 hours in a 24 hour period.   methocarbamol  750 MG tablet Commonly known as: ROBAXIN  Take 1 tablet (750 mg total) by mouth daily as needed.   Mounjaro  10 MG/0.5ML Pen Generic drug: tirzepatide  Inject 10 mg into the skin once a week. What changed: Another medication with the same name was removed. Continue taking this medication, and follow the directions you see here.   nystatin  powder Commonly known as: MYCOSTATIN /NYSTOP  Apply 1 Application topically 3 (three) times daily as needed.   ondansetron  4 MG tablet Commonly known as: ZOFRAN  4 mg   traMADol 50 MG tablet Commonly known as: ULTRAM Take 1 tablet (50 mg total) by mouth every 6 (six) hours as needed for severe pain (pain score 7-10) or moderate pain (pain score 4-6).   zolpidem  5 MG tablet Commonly known as: AMBIEN  Take 1 tablet (5 mg total) by mouth at bedtime as needed.               Durable Medical Equipment  (From admission, onward)  Start     Ordered   05/14/24 0920  For home use only DME Walker rolling  Once       Question Answer Comment  Walker: With 5 Inch Wheels   Patient needs a walker to treat with the following condition Left patella fracture      05/14/24 0920            Follow-up Information     Kip Lynwood Double, PA-C Follow up in 2 week(s).   Specialty: Physician Assistant Why: For staple removal and x-rays of the left knee Contact information: [address] [place] [address] [phone]         Marikay Eva POUR, PA Follow up in 5 day(s).   Specialty: Physician Assistant Contact information: [phone] [facility] [address] [facility] [place] [address] [phone]                Discharge Exam: Fredricka Weights   05/12/24 1941 05/13/24 1529  Weight: 91.2 kg 91.2 kg   Physical Exam HENT:     Head: Normocephalic.     Mouth/Throat:     Pharynx: No oropharyngeal  exudate.  Eyes:     General: Lids are normal.  Cardiovascular:     Rate and Rhythm: Normal rate and regular rhythm.     Heart sounds: Normal heart sounds, S1 normal and S2 normal.  Pulmonary:     Breath sounds: No decreased breath sounds, wheezing, rhonchi or rales.  Abdominal:     Palpations: Abdomen is soft.     Tenderness: There is no abdominal tenderness.  Skin:    General: Skin is warm.     Findings: No rash.  Neurological:     Mental Status: She is alert and oriented to person, place, and time.      Condition at discharge: stable  The results of significant diagnostics from this hospitalization (including imaging, microbiology, ancillary and laboratory) are listed below for reference.   Imaging Studies: DG Knee 1-2 Views Left Result Date: 05/13/2024 CLINICAL DATA:  Elective surgery with internal fixation of the patella. EXAM: LEFT KNEE - 1-2 VIEW COMPARISON:  05/12/2024 FINDINGS: Intraoperative fluoroscopy is utilized for surgical control purposes. Fluoroscopy time is recorded at 19 seconds. Dose is 0.7863 mGy. 2 spot fluoroscopic images are provided. Spot fluoroscopic images demonstrate placement of 2 fixation screws across a fracture of the midpole of the patella. Fracture fragments appear well approximated with minimal cortical irregularity at the articular surface. IMPRESSION: Intraoperative fluoroscopy is obtained for surgical control purposes during internal fixation of a patellar fracture. Electronically Signed   By: Elsie Gravely M.D.   On: 05/13/2024 20:12   DG C-Arm 1-60 Min-No Report Result Date: 05/13/2024 Fluoroscopy was utilized by the requesting physician.  No radiographic interpretation.   DG Knee Complete 4 Views Left Result Date: 05/12/2024 CLINICAL DATA:  Fall, knee pain EXAM: LEFT KNEE - COMPLETE 4+ VIEW COMPARISON:  None Available. FINDINGS: Comminuted fracture noted through the mid pole of the left patella. 1.7 cm of distraction of fracture fragments.  Overlying soft tissue swelling. Small joint effusion. No subluxation or dislocation. IMPRESSION: Displaced comminuted left patellar midpole fracture. Electronically Signed   By: Franky Crease M.D.   On: 05/12/2024 20:23    Microbiology: Results for orders placed or performed during the hospital encounter of 05/12/24  Surgical PCR screen     Status: None   Collection Time: 05/13/24  1:50 AM   Specimen: Nasal Mucosa; Nasal Swab  Result Value  Ref Range Status   MRSA, PCR NEGATIVE NEGATIVE Final   Staphylococcus aureus NEGATIVE NEGATIVE Final    Comment: (NOTE) The Xpert SA Assay (FDA approved for NASAL specimens in patients 34 years of age and older), is one component of a comprehensive surveillance program. It is not intended to diagnose infection nor to guide or monitor treatment. Performed at [facility], [address] [place], [place] [address]     Labs: CBC: Recent Labs  Lab 05/12/24 2037 05/14/24 0420  WBC 10.8* 9.3  HGB 14.0 11.3*  HCT 41.8 34.0*  MCV 93.9 94.4  PLT 147* 131*   Basic Metabolic Panel: Recent Labs  Lab 05/12/24 2037 05/14/24 0420  NA 141 139  K 4.4 4.3  CL 106 106  CO2 25 27  GLUCOSE 96 104*  BUN 16 18  CREATININE 0.96 1.08*  CALCIUM  9.1 8.2*   Liver Function Tests: Recent Labs  Lab 05/12/24 2037  AST 23  ALT 19  ALKPHOS 112  BILITOT 0.7  PROT 6.6  ALBUMIN 3.7   CBG: Recent Labs  Lab 05/13/24 1547 05/13/24 1548 05/13/24 1636 05/13/24 1827 05/14/24 0737  GLUCAP 62* 62* 149* 74 93    Discharge time spent: greater than 30 minutes.  Signed: Charlie Patterson, MD [facility] 05/14/2024

## 2024-05-14 NOTE — Discharge Planning (Signed)
 IV removed. Instructions given to patient and family at bedside.  All questions answered Meds to bed delivered  Patient waiting DME and HH.  No other concerns at this time

## 2024-05-14 NOTE — TOC Transition Note (Signed)
 Transition of Care [facility]) - Discharge Note   Patient Details  Name: Patricia [name] MRN: [id number] Date of Birth: 23-Jan-1956  Transition of Care [facility]) CM/SW Contact:  Marinda Cooks, RN Phone Number: 05/14/2024, 1:21 PM   Clinical Narrative:    This CM updated by covering MD pt medically cleared to dc today and has active DC order . This CM spoke with [facility] liaison @ [facility]  HH arranged & DME. DC transportation confirmed for pt with Medical team updated . No additional DC needs requested by medical team or identified by CM at this time .    Final next level of care: Home w Home Health Services Barriers to Discharge: No Barriers Identified   Patient Goals and CMS Choice     Choice offered to / list presented to : Patient     Name of family member notified: Pt Patient and family notified of of transfer: 05/14/24             DME Arranged: 3-N-1, Walker rolling DME Agency: [employer] Date DME Agency Contacted: 05/14/24 Time DME Agency Contacted: 1250 Representative spoke with at DME Agency: [facility] HH Arranged: PT HH Agency: [facility] Date [facility] Agency Contacted: 05/14/24 Time HH Agency Contacted: 1320 Representative spoke with at [facility] Agency: [facility]  Social Drivers of Health (SDOH) Interventions SDOH Screenings   Food Insecurity: No Food Insecurity (05/13/2024)  Housing: Low Risk  (05/13/2024)  Transportation Needs: No Transportation Needs (05/13/2024)  Utilities: Not At Risk (05/13/2024)  Financial Resource Strain: Low Risk  (02/16/2024)   Received from [facility]  Social Connections: Moderately Integrated (05/13/2024)  Tobacco Use: Medium Risk (05/12/2024)     Readmission Risk Interventions     No data to display

## 2024-05-14 NOTE — Progress Notes (Signed)
 Anticoagulation monitoring(Lovenox):  68 yo female ordered Lovenox 40 mg Q24h    Filed Weights   05/12/24 1941 05/13/24 1529  Weight: 91.2 kg (201 lb) 91.2 kg (201 lb 1 oz)   BMI 36.8    Lab Results  Component Value Date   CREATININE 0.96 05/12/2024   CREATININE 0.72 01/06/2019   CREATININE 0.59 12/25/2017   Estimated Creatinine Clearance: 60.5 mL/min (by C-G formula based on SCr of 0.96 mg/dL). Hemoglobin & Hematocrit     Component Value Date/Time   HGB 14.0 05/12/2024 2037   HCT 41.8 05/12/2024 2037     Per Protocol for Patient with estCrcl > 30 ml/min and BMI > 30, will transition to Lovenox 45 mg Q24h.

## 2024-05-14 NOTE — Evaluation (Signed)
 Physical Therapy Evaluation Patient Details Name: Patricia [name] MRN: [id number] DOB: 13-Jun-1956 Today's Date: 05/14/2024  History of Present Illness  Pt is a 68 y/o F admitted on 05/12/24 after presenting with c/o a fall. Pt found to have displaced comminuted fracture of the left patella, underwent ORIF of L patella on 05/13/24. PMH: HTN, DM, sleep apnea, morbid obesity, NASH cirrhosis, OA, spinal stenosis, cervical CA, depression, HLD  Clinical Impression  Pt seen for PT evaluation with pt agreeable, daughter arriving mid way through session. Pt reports prior to admission she was independent without AD, driving, notes 1 other fall in the past 6 months. On this date, PT educates pt on ROM & weight bearing restrictions, use of polar care, provided pt with HEP handout.  Pt is able to ambulate in room, bathroom, & hallway with RW & CGA fade to supervision. Pt negotiates 4 steps with B rails & CGA. Pt is doing well with mobility & hopeful for d/c home today.      If plan is discharge home, recommend the following: A little help with walking and/or transfers;A little help with bathing/dressing/bathroom;Assistance with cooking/housework;Assist for transportation;Help with stairs or ramp for entrance   Can travel by private vehicle        Equipment Recommendations Rolling walker (2 wheels);BSC/3in1  Recommendations for Other Services       Functional Status Assessment       Precautions / Restrictions Precautions Precautions: Fall Restrictions Weight Bearing Restrictions Per Provider Order: Yes LLE Weight Bearing Per Provider Order:  (PWB 50% unless pt cannot maintain, then can progress to WBAT. Brace locked in extension for mobility. Can perform ROM with brace unlocked 0-60 degrees. (Per secure chat with Dr. Edie on 05/14/24)) Other Position/Activity Restrictions: PWB 50% unless pt cannot maintain, then can progress to WBAT. Brace locked in extension for mobility. Can perform ROM with brace  unlocked 0-60 degrees. (Per secure chat with Dr. Edie on 05/14/24)      Mobility  Bed Mobility Overal bed mobility: Needs Assistance Bed Mobility: Supine to Sit     Supine to sit: Modified independent (Device/Increase time), HOB elevated, Used rails (exit L side of bed)          Transfers Overall transfer level: Needs assistance Equipment used: Rolling walker (2 wheels) Transfers: Sit to/from Stand Sit to Stand: Supervision           General transfer comment: sit>stand from EOB with RW, sit<>stand from recliner, toilet    Ambulation/Gait Ambulation/Gait assistance: Supervision, Contact guard assist Gait Distance (Feet): 150 Feet Assistive device: Rolling walker (2 wheels) Gait Pattern/deviations: Decreased step length - right, Decreased step length - left, Decreased stride length, Decreased weight shift to left Gait velocity: slightly decreased     General Gait Details: Pt ambulates in room, bathroom, hallway.  Stairs Stairs: Yes Stairs assistance: Contact guard assist Stair Management: Two rails, Step to pattern Number of Stairs: 4 (6) General stair comments: PT educated pt on compensatory pattern with good return Clinical research associate     Tilt Bed    Modified Rankin (Stroke Patients Only)       Balance Overall balance assessment: Needs assistance Sitting-balance support: Feet supported Sitting balance-Leahy Scale: Good     Standing balance support: During functional activity, Bilateral upper extremity supported, Reliant on assistive device for balance, No upper extremity supported Standing balance-Leahy Scale: Fair Standing balance comment: no LOB standing at sink for hand hygiene  Pertinent Vitals/Pain Pain Assessment Pain Assessment: Faces Faces Pain Scale: Hurts a little bit Pain Location: R shoulder bursitis Pain Descriptors / Indicators: Discomfort Pain Intervention(s): Monitored during session     Home Living Family/patient expects to be discharged to:: Private residence Living Arrangements: Alone Available Help at Discharge: Family;Available 24 hours/day Type of Home: Mobile home Home Access: Stairs to enter Entrance Stairs-Rails: Right;Left;Can reach both Entrance Stairs-Number of Steps: 8 at front, 3 at back   Home Layout: One level Home Equipment: Grab bars - tub/shower (suction grab bars)      Prior Function Prior Level of Function : Driving             Mobility Comments: Ambulatory without AD, 2 falls in the past 6 months (including this one) ADLs Comments: independent     Extremity/Trunk Assessment   Upper Extremity Assessment Upper Extremity Assessment: Overall WFL for tasks assessed (reports R shoulder bursitis)    Lower Extremity Assessment Lower Extremity Assessment: LLE deficits/detail LLE: Unable to fully assess due to immobilization       Communication   Communication Communication: No apparent difficulties    Cognition Arousal: Alert Behavior During Therapy: WFL for tasks assessed/performed   PT - Cognitive impairments: No apparent impairments                         Following commands: Intact       Cueing Cueing Techniques: Verbal cues     General Comments General comments (skin integrity, edema, etc.): continent void on toilet    Exercises Other Exercises Other Exercises: Educated pt on ability to unlock brace 0-60 degrees for ROM but need for locked in full extension for gait. Other Exercises: Provided pt with supine HEP handout.   Assessment/Plan    PT Assessment Patient needs continued PT services  PT Problem List Decreased strength;Decreased range of motion;Decreased activity tolerance;Decreased balance;Decreased knowledge of precautions;Decreased mobility;Decreased safety awareness;Decreased knowledge of use of DME       PT Treatment Interventions DME instruction;Balance training;Gait  training;Neuromuscular re-education;Stair training;Functional mobility training;Therapeutic activities;Therapeutic exercise;Patient/family education    PT Goals (Current goals can be found in the Care Plan section)  Acute Rehab PT Goals Patient Stated Goal: go home PT Goal Formulation: With patient Time For Goal Achievement: 05/28/24 Potential to Achieve Goals: Good    Frequency 7X/week     Co-evaluation               AM-PAC PT 6 Clicks Mobility  Outcome Measure Help needed turning from your back to your side while in a flat bed without using bedrails?: None Help needed moving from lying on your back to sitting on the side of a flat bed without using bedrails?: A Little Help needed moving to and from a bed to a chair (including a wheelchair)?: A Little Help needed standing up from a chair using your arms (e.g., wheelchair or bedside chair)?: A Little Help needed to walk in hospital room?: A Little Help needed climbing 3-5 steps with a railing? : A Little 6 Click Score: 19    End of Session Equipment Utilized During Treatment: Left knee immobilizer Activity Tolerance: Patient tolerated treatment well Patient left: in chair;with call bell/phone within reach;with family/visitor present   PT Visit Diagnosis: Other abnormalities of gait and mobility (R26.89);Difficulty in walking, not elsewhere classified (R26.2);Muscle weakness (generalized) (M62.81)    Time: 8862-8799 PT Time Calculation (min) (ACUTE ONLY): 23 min   Charges:   PT Evaluation $  PT Eval Low Complexity: 1 Low PT Treatments $Gait Training: 8-22 mins PT General Charges $$ ACUTE PT VISIT: 1 Visit         Richerd Pinal, PT, DPT 05/14/24, 12:16 PM   Richerd CHRISTELLA Pinal 05/14/2024, 12:14 PM

## 2024-05-14 NOTE — Anesthesia Postprocedure Evaluation (Signed)
 Anesthesia Post Note  Patient: Patricia [name]  Procedure(s) Performed: OPEN REDUCTION INTERNAL FIXATION (ORIF) PATELLA (Left: Knee)  Patient location during evaluation: PACU Anesthesia Type: General Level of consciousness: awake and alert Pain management: pain level controlled Vital Signs Assessment: post-procedure vital signs reviewed and stable Respiratory status: spontaneous breathing, nonlabored ventilation and respiratory function stable Cardiovascular status: blood pressure returned to baseline and stable Postop Assessment: no apparent nausea or vomiting Anesthetic complications: no   No notable events documented.   Last Vitals:  Vitals:   05/14/24 0450 05/14/24 0736  BP: (!) 115/56 (!) 97/56  Pulse: 62 67  Resp: 15 17  Temp: 36.4 C 36.6 C  SpO2: 96% 95%    Last Pain:  Vitals:   05/14/24 0924  TempSrc:   PainSc: 2                  Camellia Merilee Louder

## 2024-05-14 NOTE — Plan of Care (Signed)
  Problem: Education: Goal: Ability to describe self-care measures that may prevent or decrease complications (Diabetes Survival Skills Education) will improve Outcome: Progressing Goal: Individualized Educational Video(s) Outcome: Progressing   Problem: Coping: Goal: Ability to adjust to condition or change in health will improve Outcome: Progressing   Problem: Fluid Volume: Goal: Ability to maintain a balanced intake and output will improve Outcome: Progressing   Problem: Health Behavior/Discharge Planning: Goal: Ability to identify and utilize available resources and services will improve Outcome: Progressing Goal: Ability to manage health-related needs will improve Outcome: Progressing   Problem: Metabolic: Goal: Ability to maintain appropriate glucose levels will improve Outcome: Progressing   Problem: Nutritional: Goal: Maintenance of adequate nutrition will improve Outcome: Progressing Goal: Progress toward achieving an optimal weight will improve Outcome: Progressing   Problem: Education: Goal: Knowledge of General Education information will improve Description: Including pain rating scale, medication(s)/side effects and non-pharmacologic comfort measures Outcome: Progressing   Problem: Tissue Perfusion: Goal: Adequacy of tissue perfusion will improve Outcome: Progressing   Problem: Health Behavior/Discharge Planning: Goal: Ability to manage health-related needs will improve Outcome: Progressing   Problem: Clinical Measurements: Goal: Ability to maintain clinical measurements within normal limits will improve Outcome: Progressing Goal: Will remain free from infection Outcome: Progressing Goal: Diagnostic test results will improve Outcome: Progressing Goal: Respiratory complications will improve Outcome: Progressing Goal: Cardiovascular complication will be avoided Outcome: Progressing   Problem: Nutrition: Goal: Adequate nutrition will be  maintained Outcome: Progressing   Problem: Coping: Goal: Level of anxiety will decrease Outcome: Progressing   Problem: Elimination: Goal: Will not experience complications related to bowel motility Outcome: Progressing Goal: Will not experience complications related to urinary retention Outcome: Progressing   Problem: Safety: Goal: Ability to remain free from injury will improve Outcome: Progressing   Problem: Pain Managment: Goal: General experience of comfort will improve and/or be controlled Outcome: Progressing   Problem: Skin Integrity: Goal: Risk for impaired skin integrity will decrease Outcome: Progressing

## 2024-05-15 ENCOUNTER — Encounter: Payer: Self-pay | Admitting: Student

## 2024-05-15 ENCOUNTER — Other Ambulatory Visit: Payer: Self-pay

## 2024-05-15 MED ORDER — OXYCODONE HCL 5 MG PO TABS
5.0000 mg | ORAL_TABLET | ORAL | 0 refills | Status: AC | PRN
  Filled 2024-05-15: qty 42, 7d supply, fill #0

## 2024-05-16 ENCOUNTER — Encounter: Payer: Self-pay | Admitting: Surgery

## 2024-05-17 ENCOUNTER — Other Ambulatory Visit: Payer: Self-pay

## 2024-05-17 MED ORDER — ONDANSETRON HCL 4 MG PO TABS
4.0000 mg | ORAL_TABLET | Freq: Every day | ORAL | 1 refills | Status: AC | PRN
  Filled 2024-05-17: qty 20, 20d supply, fill #0

## 2024-05-23 ENCOUNTER — Other Ambulatory Visit: Payer: Self-pay

## 2024-08-24 ENCOUNTER — Other Ambulatory Visit: Payer: Self-pay

## 2024-08-24 DIAGNOSIS — Z87891 Personal history of nicotine dependence: Secondary | ICD-10-CM

## 2024-08-24 DIAGNOSIS — F17210 Nicotine dependence, cigarettes, uncomplicated: Secondary | ICD-10-CM

## 2024-08-24 DIAGNOSIS — Z122 Encounter for screening for malignant neoplasm of respiratory organs: Secondary | ICD-10-CM
